# Patient Record
Sex: Female | Born: 1970 | Race: White | Hispanic: No | Marital: Single | Smoking: Current every day smoker
Health system: Southern US, Community
[De-identification: ages and names within clinical notes are randomized; demographics above are authoritative.]

## PROBLEM LIST (undated history)

## (undated) DIAGNOSIS — R569 Unspecified convulsions: Secondary | ICD-10-CM

## (undated) DIAGNOSIS — F1910 Other psychoactive substance abuse, uncomplicated: Secondary | ICD-10-CM

## (undated) DIAGNOSIS — F419 Anxiety disorder, unspecified: Secondary | ICD-10-CM

## (undated) DIAGNOSIS — E785 Hyperlipidemia, unspecified: Secondary | ICD-10-CM

## (undated) DIAGNOSIS — K759 Inflammatory liver disease, unspecified: Secondary | ICD-10-CM

## (undated) DIAGNOSIS — J45909 Unspecified asthma, uncomplicated: Secondary | ICD-10-CM

## (undated) DIAGNOSIS — I10 Essential (primary) hypertension: Secondary | ICD-10-CM

## (undated) HISTORY — DX: Hyperlipidemia, unspecified: E78.5

## (undated) HISTORY — DX: Inflammatory liver disease, unspecified: K75.9

## (undated) HISTORY — DX: Essential (primary) hypertension: I10

## (undated) HISTORY — DX: Unspecified asthma, uncomplicated: J45.909

## (undated) HISTORY — DX: Anxiety disorder, unspecified: F41.9

## (undated) HISTORY — DX: Unspecified convulsions: R56.9

## (undated) HISTORY — DX: Other psychoactive substance abuse, uncomplicated: F19.10

---

## 2019-12-14 DIAGNOSIS — D219 Benign neoplasm of connective and other soft tissue, unspecified: Secondary | ICD-10-CM

## 2019-12-14 DIAGNOSIS — F1290 Cannabis use, unspecified, uncomplicated: Secondary | ICD-10-CM

## 2019-12-14 DIAGNOSIS — N390 Urinary tract infection, site not specified: Secondary | ICD-10-CM

## 2019-12-14 DIAGNOSIS — E876 Hypokalemia: Secondary | ICD-10-CM

## 2019-12-14 DIAGNOSIS — R112 Nausea with vomiting, unspecified: Secondary | ICD-10-CM

## 2020-02-05 ENCOUNTER — Ambulatory Visit: Payer: Self-pay | Admitting: Cardiology

## 2020-02-05 ENCOUNTER — Other Ambulatory Visit: Payer: Self-pay | Admitting: *Deleted

## 2020-02-06 ENCOUNTER — Other Ambulatory Visit: Payer: Self-pay

## 2020-02-06 ENCOUNTER — Ambulatory Visit (INDEPENDENT_AMBULATORY_CARE_PROVIDER_SITE_OTHER): Payer: Self-pay | Admitting: Cardiology

## 2020-02-06 ENCOUNTER — Encounter: Payer: Self-pay | Admitting: Cardiology

## 2020-02-06 VITALS — BP 170/100 | HR 64 | Ht 63.0 in | Wt 129.2 lb

## 2020-02-06 DIAGNOSIS — E782 Mixed hyperlipidemia: Secondary | ICD-10-CM

## 2020-02-06 DIAGNOSIS — R9431 Abnormal electrocardiogram [ECG] [EKG]: Secondary | ICD-10-CM

## 2020-02-06 DIAGNOSIS — Z720 Tobacco use: Secondary | ICD-10-CM

## 2020-02-06 DIAGNOSIS — I10 Essential (primary) hypertension: Secondary | ICD-10-CM

## 2020-02-06 DIAGNOSIS — R079 Chest pain, unspecified: Secondary | ICD-10-CM

## 2020-02-06 MED ORDER — NITROGLYCERIN 0.4 MG SL SUBL: 0.4000 mg | SUBLINGUAL_TABLET | SUBLINGUAL | 11 refills | Status: AC | PRN

## 2020-02-06 MED ORDER — LOSARTAN POTASSIUM 25 MG PO TABS
25.0000 mg | ORAL_TABLET | Freq: Every day | ORAL | 1 refills | Status: AC
Start: 2020-02-06 — End: 2020-05-06

## 2020-02-06 MED ORDER — ROSUVASTATIN CALCIUM 5 MG PO TABS
5.0000 mg | ORAL_TABLET | Freq: Every day | ORAL | 1 refills | Status: AC
Start: 2020-02-06 — End: 2020-05-06

## 2020-02-06 NOTE — Patient Instructions (Addendum)
Medication Instructions:  Your physician has recommended you make the following change in your medication:   STOP: Lipitor   START: Losartan 25 mg daily   START: Crestor 5 mg daily   TAKE NITRO AS NEEDED FOR CHEST PAIN: Nitroglycerin 0.4 mg sublingual (under your tongue) as needed for chest pain. If experiencing chest pain, stop what you are doing and sit down. Take 1 nitroglycerin and wait 5 minutes. If chest pain continues, take another nitroglycerin and wait 5 minutes. If chest pain does not subside, take 1 more nitroglycerin and dial 911. You make take a total of 3 nitroglycerin in a 15 minute time frame.   *If you need a refill on your cardiac medications before your next appointment, please call your pharmacy*   Lab Work: None.  If you have labs (blood work) drawn today and your tests are completely normal, you will receive your results only by: Marland Kitchen MyChart Message (if you have MyChart) OR . A paper copy in the mail If you have any lab test that is abnormal or we need to change your treatment, we will call you to review the results.   Testing/Procedures: Your physician has requested that you have an echocardiogram. Echocardiography is a painless test that uses sound waves to create images of your heart. It provides your doctor with information about the size and shape of your heart and how well your heart's chambers and valves are working. This procedure takes approximately one hour. There are no restrictions for this procedure.     [facility] [address] [place], [place] [address] Phone:  [phone]    Please arrive 15 minutes prior to your appointment time for registration and insurance purposes.  The test will take approximately 3 to 4 hours to complete; you may bring reading material.  If someone comes with you to your appointment, they will need to remain in the main lobby due to limited space in the testing area. **If you are pregnant  or breastfeeding, please notify the nuclear lab prior to your appointment**  How to prepare for your Myocardial Perfusion Test: . Do not eat or drink 3 hours prior to your test, except you may have water. . Do not consume products containing caffeine (regular or decaffeinated) 12 hours prior to your test. (ex: coffee, chocolate, sodas, tea). . Do bring a list of your current medications with you.  If not listed below, you may take your medications as normal. . Do wear comfortable clothes (no dresses or overalls) and walking shoes, tennis shoes preferred (No heels or open toe shoes are allowed). . Do NOT wear cologne, perfume, aftershave, or lotions (deodorant is allowed). . If these instructions are not followed, your test will have to be rescheduled.  Please report to [address] for your test.  If you have questions or concerns about your appointment, you can call the [facility] at [phone].  If you cannot keep your appointment, please provide 24 hours notification to the Nuclear Lab, to avoid a possible $50 charge to your account.    Follow-Up: At [facility], you and your health needs are our priority.  As part of our continuing mission to provide you with exceptional heart care, we have created designated Provider Care Teams.  These Care Teams include your primary Cardiologist (physician) and Advanced Practice Providers (APPs -  Physician Assistants and Nurse Practitioners) who all work together to provide you with the care you  need, when you need it.  We recommend signing up for the patient portal called "MyChart".  Sign up information is provided on this After Visit Summary.  MyChart is used to connect with patients for Virtual Visits (Telemedicine).  Patients are able to view lab/test results, encounter notes, upcoming appointments, etc.  Non-urgent messages can be sent to your provider as well.   To learn more about what you can do with  MyChart, go to [email or web address].    Your next appointment:   1 month(s)  The format for your next appointment:   In Person  Provider:   Berniece Salines, DO   Other Instructions   Cardiac Nuclear Scan A cardiac nuclear scan is a test that measures blood flow to the heart when a person is resting and when he or she is exercising. The test looks for problems such as:  Not enough blood reaching a portion of the heart.  The heart muscle not working normally. You may need this test if:  You have heart disease.  You have had abnormal lab results.  You have had heart surgery or a balloon procedure to open up blocked arteries (angioplasty).  You have chest pain.  You have shortness of breath. In this test, a radioactive dye (tracer) is injected into your bloodstream. After the tracer has traveled to your heart, an imaging device is used to measure how much of the tracer is absorbed by or distributed to various areas of your heart. This procedure is usually done at a hospital and takes 2-4 hours. Tell a health care provider about:  Any allergies you have.  All medicines you are taking, including vitamins, herbs, eye drops, creams, and over-the-counter medicines.  Any problems you or family members have had with anesthetic medicines.  Any blood disorders you have.  Any surgeries you have had.  Any medical conditions you have.  Whether you are pregnant or may be pregnant. What are the risks? Generally, this is a safe procedure. However, problems may occur, including:  Serious chest pain and heart attack. This is only a risk if the stress portion of the test is done.  Rapid heartbeat.  Sensation of warmth in your chest. This usually passes quickly.  Allergic reaction to the tracer. What happens before the procedure?  Ask your health care provider about changing or stopping your regular medicines. This is especially important if you are taking diabetes medicines or  blood thinners.  Follow instructions from your health care provider about eating or drinking restrictions.  Remove your jewelry on the day of the procedure. What happens during the procedure?  An IV will be inserted into one of your veins.  Your health care provider will inject a small amount of radioactive tracer through the IV.  You will wait for 20-40 minutes while the tracer travels through your bloodstream.  Your heart activity will be monitored with an electrocardiogram (ECG).  You will lie down on an exam table.  Images of your heart will be taken for about 15-20 minutes.  You may also have a stress test. For this test, one of the following may be done: ? You will exercise on a treadmill or stationary bike. While you exercise, your heart's activity will be monitored with an ECG, and your blood pressure will be checked. ? You will be given medicines that will increase blood flow to parts of your heart. This is done if you are unable to exercise.  When blood flow to your heart has  peaked, a tracer will again be injected through the IV.  After 20-40 minutes, you will get back on the exam table and have more images taken of your heart.  Depending on the type of tracer used, scans may need to be repeated 3-4 hours later.  Your IV line will be removed when the procedure is over. The procedure may vary among health care providers and hospitals. What happens after the procedure?  Unless your health care provider tells you otherwise, you may return to your normal schedule, including diet, activities, and medicines.  Unless your health care provider tells you otherwise, you may increase your fluid intake. This will help to flush the contrast dye from your body. Drink enough fluid to keep your urine pale yellow.  Ask your health care provider, or the department that is doing the test: ? When will my results be ready? ? How will I get my results? Summary  A cardiac nuclear scan  measures the blood flow to the heart when a person is resting and when he or she is exercising.  Tell your health care provider if you are pregnant.  Before the procedure, ask your health care provider about changing or stopping your regular medicines. This is especially important if you are taking diabetes medicines or blood thinners.  After the procedure, unless your health care provider tells you otherwise, increase your fluid intake. This will help flush the contrast dye from your body.  After the procedure, unless your health care provider tells you otherwise, you may return to your normal schedule, including diet, activities, and medicines. This information is not intended to replace advice given to you by your health care provider. Make sure you discuss any questions you have with your health care provider. Document Revised: 01/30/2018 Document Reviewed: 01/30/2018 Elsevier Patient Education  [place].

## 2020-02-06 NOTE — Progress Notes (Signed)
Cardiology Office Note:    Date:  02/06/2020   ID:  [name], DOB 09/27/1970, MRN [id number]  PCP:  Christiane Ha, FNP  Cardiologist:  Berniece Salines, DO  Electrophysiologist:  None   Referring MD: Christiane Ha, FNP   " I have been experiencing chest pain.     History of Present Illness:    Allison Kramer is a 49 y.o. female with a hx of a hypertension, hyperlipidemia presents today to be evaluated for chest pain shortness of breath.  The patient tells me for several months she has been experiencing intermittent chest pain.  She describes it as a midsternal stabbing pain that starts from the mid chest and radiates to her right arm and then down her shoulders and also her upper jaw.  She notes that sometimes it started as epigastric but most time is midsternal.  She states that whenever this happened she experienced significant shortness of breath with diaphoresis.  It last for few minutes prior to resolution.  She quantifies the pain as 6 out of 10.  She notes that most recent episode was about 1 week.  She denies any dizziness, lightheadedness.  Past Medical History:  Diagnosis Date  . Anxiety   . Asthma   . Drug abuse ([place])   . Hepatitis   . Hyperlipidemia   . Hypertension   . Seizures ([place])     History reviewed. No pertinent surgical history.  Current Medications: Current Meds  Medication Sig  . busPIRone (BUSPAR) 15 MG tablet Take by mouth.  . Cetirizine HCl 10 MG CAPS   . divalproex (DEPAKOTE ER) 500 MG 24 hr tablet Take by mouth.  . hydrOXYzine (ATARAX/VISTARIL) 50 MG tablet Take by mouth.  . mirtazapine (REMERON) 15 MG tablet Take 15 mg by mouth as needed (sleep).   . phenylephrine (SUDAFED PE) 10 MG TABS tablet Take 10 mg by mouth every 6 (six) hours as needed.   . valACYclovir (VALTREX) 1000 MG tablet Take by mouth.  . [DISCONTINUED] risperiDONE (RISPERDAL) 1 MG tablet Take 1 mg by mouth.      Allergies:   Cephalexin, Doxycycline,  Erythromycin base, Penicillins, Carbamazepine, Clonidine, Erythromycin, Lithium, Other, Oxcarbazepine, Sulfamethoxazole, and Latex   Social History   Socioeconomic History  . Marital status: Single    Spouse name: Not on file  . Number of children: Not on file  . Years of education: Not on file  . Highest education level: Not on file  Occupational History  . Not on file  Tobacco Use  . Smoking status: Current Every Day Smoker  . Smokeless tobacco: Never Used  Substance and Sexual Activity  . Alcohol use: Not Currently  . Drug use: Yes  . Sexual activity: Not on file  Other Topics Concern  . Not on file  Social History Narrative  . Not on file   Social Determinants of Health   Financial Resource Strain:   . Difficulty of Paying Living Expenses:   Food Insecurity:   . Worried About Charity fundraiser in the Last Year:   . Arboriculturist in the Last Year:   Transportation Needs:   . Film/video editor (Medical):   Marland Kitchen Lack of Transportation (Non-Medical):   Physical Activity:   . Days of Exercise per Week:   . Minutes of Exercise per Session:   Stress:   . Feeling of Stress :   Social Connections:   . Frequency of Communication with Friends and Family:   .  Frequency of Social Gatherings with Friends and Family:   . Attends Religious Services:   . Active Member of Clubs or Organizations:   . Attends Archivist Meetings:   Marland Kitchen Marital Status:      Family History: The patient's family history includes Cancer in her brother, father, and mother.  ROS:   Review of Systems  Constitution: Negative for decreased appetite, fever and weight gain.  HENT: Negative for congestion, ear discharge, hoarse voice and sore throat.   Eyes: Negative for discharge, redness, vision loss in right eye and visual halos.  Cardiovascular: Reports for chest pain, dyspnea on exertion.  Negative for leg swelling, orthopnea and palpitations.  Respiratory: Negative for cough,  hemoptysis, shortness of breath and snoring.   Endocrine: Negative for heat intolerance and polyphagia.  Hematologic/Lymphatic: Negative for bleeding problem. Does not bruise/bleed easily.  Skin: Negative for flushing, nail changes, rash and suspicious lesions.  Musculoskeletal: Negative for arthritis, joint pain, muscle cramps, myalgias, neck pain and stiffness.  Gastrointestinal: Negative for abdominal pain, bowel incontinence, diarrhea and excessive appetite.  Genitourinary: Negative for decreased libido, genital sores and incomplete emptying.  Neurological: Negative for brief paralysis, focal weakness, headaches and loss of balance.  Psychiatric/Behavioral: Negative for altered mental status, depression and suicidal ideas.  Allergic/Immunologic: Negative for HIV exposure and persistent infections.    EKGs/Labs/Other Studies Reviewed:    The following studies were reviewed today:   EKG:  The ekg ordered today demonstrates sinus rhythm, heart rate 64 bpm with sinus arrhythmia.  Nonspecific ST changes in the lateral leads compared to EKG done on December 14, 2019 at [facility] at which time he was in sinus bradycardia and those changes were also noted  CT abdomen pelvis: December 14, 2019 Impression CT findings consistent with focal pyelonephritis/lobular nephronia involving the upper pole midpole junction region of the left kidney.  No abscess.  Slight bladder wall enhancement could suggest cystitis.  No other significant finding  Recent Labs: Lower done on December 15, 2019: Sodium 138, potassium 3.9, chloride 104, bicarb 24, BUN 19, creatinine 0.7, glucose 131 WBC 12.4, hemoglobin 12.0, hematocrit 35.7, platelet 273 Recent Lipid Panel No results found for: CHOL, TRIG, HDL, CHOLHDL, VLDL, LDLCALC, LDLDIRECT  Physical Exam:    VS:  BP (!) 170/100 (BP Location: Left Arm, Patient Position: Sitting, Cuff Size: Normal)   Pulse 64   Ht 5\' 3"  (1.6 m)   Wt 129 lb 3.2 oz (58.6 kg)   SpO2  98% Comment: at rest  BMI 22.89 kg/m     Wt Readings from Last 3 Encounters:  02/06/20 129 lb 3.2 oz (58.6 kg)     GEN: Well nourished, well developed in no acute distress HEENT: Normal NECK: No JVD; No carotid bruits LYMPHATICS: No lymphadenopathy CARDIAC: S1S2 noted,RRR, no murmurs, rubs, gallops RESPIRATORY:  Clear to auscultation without rales, wheezing or rhonchi  ABDOMEN: Soft, non-tender, non-distended, +bowel sounds, no guarding. EXTREMITIES: No edema, No cyanosis, no clubbing MUSCULOSKELETAL:  No deformity  SKIN: Warm and dry NEUROLOGIC:  Alert and oriented x 3, non-focal PSYCHIATRIC:  Normal affect, good insight  ASSESSMENT:    1. Abnormal EKG   2. Chest pain, moderate coronary artery risk   3. Essential hypertension   4. Tobacco use   5. Mixed hyperlipidemia    PLAN:     1.  Her chest pain is concerning given her intermediate risk factors (hypertension, hyperlipidemia, smoker) for coronary artery disease I like to proceed with ischemic evaluation in this  patient.  We have discussed a pharmacologic nuclear stress test  she is willing to proceed with this.  Sublingual nitroglycerin prescription was sent, its protocol and 911 protocol explained and the patient vocalized understanding questions were answered to the patient's satisfaction.  2.  Is hypertensive in the office today started patient on losartan 25 mg daily..  3.  Past history of hyperlipidemia she has been on Lipitor but she tells me she stopped his medication as she did have vomiting every time she takes it.  Advised to stop her Lipitor and start her on Crestor 5 mg daily hoping for better reaction.  4.  Shortness of breath-I like to get an echocardiogram to assess LV and RV function, any other valvular abnormalities.  5.  Tobacco use-the patient was counseled on tobacco cessation today for 5 minutes.  Counseling included reviewing the risks of smoking tobacco products, how it impacts the patient's current  medical diagnoses and different strategies for quitting.  Pharmacotherapy to aid in tobacco cessation was not prescribed today. The patient coordinate with  primary care provider.  The patient was also advised to call   1-800-QUIT-NOW 731-266-3072) for additional help with quitting smoking.  The patient is in agreement with the above plan. The patient left the office in stable condition.  The patient will follow up in 1 month or sooner if needed.   Medication Adjustments/Labs and Tests Ordered: Current medicines are reviewed at length with the patient today.  Concerns regarding medicines are outlined above.  Orders Placed This Encounter  Procedures  . MYOCARDIAL PERFUSION IMAGING  . EKG 12-Lead  . ECHOCARDIOGRAM COMPLETE   Meds ordered this encounter  Medications  . losartan (COZAAR) 25 MG tablet    Sig: Take 1 tablet (25 mg total) by mouth daily.    Dispense:  90 tablet    Refill:  1  . rosuvastatin (CRESTOR) 5 MG tablet    Sig: Take 1 tablet (5 mg total) by mouth daily.    Dispense:  90 tablet    Refill:  1  . nitroGLYCERIN (NITROSTAT) 0.4 MG SL tablet    Sig: Place 1 tablet (0.4 mg total) under the tongue every 5 (five) minutes as needed for chest pain.    Dispense:  25 tablet    Refill:  11    Patient Instructions  Medication Instructions:  Your physician has recommended you make the following change in your medication:   STOP: Lipitor   START: Losartan 25 mg daily   START: Crestor 5 mg daily   TAKE NITRO AS NEEDED FOR CHEST PAIN: Nitroglycerin 0.4 mg sublingual (under your tongue) as needed for chest pain. If experiencing chest pain, stop what you are doing and sit down. Take 1 nitroglycerin and wait 5 minutes. If chest pain continues, take another nitroglycerin and wait 5 minutes. If chest pain does not subside, take 1 more nitroglycerin and dial 911. You make take a total of 3 nitroglycerin in a 15 minute time frame.   *If you need a refill on your cardiac  medications before your next appointment, please call your pharmacy*   Lab Work: None.  If you have labs (blood work) drawn today and your tests are completely normal, you will receive your results only by: Marland Kitchen MyChart Message (if you have MyChart) OR . A paper copy in the mail If you have any lab test that is abnormal or we need to change your treatment, we will call you to review the results.  Testing/Procedures: Your physician has requested that you have an echocardiogram. Echocardiography is a painless test that uses sound waves to create images of your heart. It provides your doctor with information about the size and shape of your heart and how well your heart's chambers and valves are working. This procedure takes approximately one hour. There are no restrictions for this procedure.     [facility] [address] [place], [place] [address] Phone:  [phone]    Please arrive 15 minutes prior to your appointment time for registration and insurance purposes.  The test will take approximately 3 to 4 hours to complete; you may bring reading material.  If someone comes with you to your appointment, they will need to remain in the main lobby due to limited space in the testing area. **If you are pregnant or breastfeeding, please notify the nuclear lab prior to your appointment**  How to prepare for your Myocardial Perfusion Test: . Do not eat or drink 3 hours prior to your test, except you may have water. . Do not consume products containing caffeine (regular or decaffeinated) 12 hours prior to your test. (ex: coffee, chocolate, sodas, tea). . Do bring a list of your current medications with you.  If not listed below, you may take your medications as normal. . Do wear comfortable clothes (no dresses or overalls) and walking shoes, tennis shoes preferred (No heels or open toe shoes are allowed). . Do NOT wear cologne, perfume, aftershave, or lotions  (deodorant is allowed). . If these instructions are not followed, your test will have to be rescheduled.  Please report to [address] for your test.  If you have questions or concerns about your appointment, you can call the [facility] at [phone].  If you cannot keep your appointment, please provide 24 hours notification to the Nuclear Lab, to avoid a possible $50 charge to your account.    Follow-Up: At [facility], you and your health needs are our priority.  As part of our continuing mission to provide you with exceptional heart care, we have created designated Provider Care Teams.  These Care Teams include your primary Cardiologist (physician) and Advanced Practice Providers (APPs -  Physician Assistants and Nurse Practitioners) who all work together to provide you with the care you need, when you need it.  We recommend signing up for the patient portal called "MyChart".  Sign up information is provided on this After Visit Summary.  MyChart is used to connect with patients for Virtual Visits (Telemedicine).  Patients are able to view lab/test results, encounter notes, upcoming appointments, etc.  Non-urgent messages can be sent to your provider as well.   To learn more about what you can do with MyChart, go to [email or web address].    Your next appointment:   1 month(s)  The format for your next appointment:   In Person  Provider:   Berniece Salines, DO   Other Instructions   Cardiac Nuclear Scan A cardiac nuclear scan is a test that measures blood flow to the heart when a person is resting and when he or she is exercising. The test looks for problems such as:  Not enough blood reaching a portion of the heart.  The heart muscle not working normally. You may need this test if:  You have heart disease.  You have had abnormal lab results.  You have had heart surgery or a balloon procedure to open up blocked arteries  (  angioplasty).  You have chest pain.  You have shortness of breath. In this test, a radioactive dye (tracer) is injected into your bloodstream. After the tracer has traveled to your heart, an imaging device is used to measure how much of the tracer is absorbed by or distributed to various areas of your heart. This procedure is usually done at a hospital and takes 2-4 hours. Tell a health care provider about:  Any allergies you have.  All medicines you are taking, including vitamins, herbs, eye drops, creams, and over-the-counter medicines.  Any problems you or family members have had with anesthetic medicines.  Any blood disorders you have.  Any surgeries you have had.  Any medical conditions you have.  Whether you are pregnant or may be pregnant. What are the risks? Generally, this is a safe procedure. However, problems may occur, including:  Serious chest pain and heart attack. This is only a risk if the stress portion of the test is done.  Rapid heartbeat.  Sensation of warmth in your chest. This usually passes quickly.  Allergic reaction to the tracer. What happens before the procedure?  Ask your health care provider about changing or stopping your regular medicines. This is especially important if you are taking diabetes medicines or blood thinners.  Follow instructions from your health care provider about eating or drinking restrictions.  Remove your jewelry on the day of the procedure. What happens during the procedure?  An IV will be inserted into one of your veins.  Your health care provider will inject a small amount of radioactive tracer through the IV.  You will wait for 20-40 minutes while the tracer travels through your bloodstream.  Your heart activity will be monitored with an electrocardiogram (ECG).  You will lie down on an exam table.  Images of your heart will be taken for about 15-20 minutes.  You may also have a stress test. For this test, one  of the following may be done: ? You will exercise on a treadmill or stationary bike. While you exercise, your heart's activity will be monitored with an ECG, and your blood pressure will be checked. ? You will be given medicines that will increase blood flow to parts of your heart. This is done if you are unable to exercise.  When blood flow to your heart has peaked, a tracer will again be injected through the IV.  After 20-40 minutes, you will get back on the exam table and have more images taken of your heart.  Depending on the type of tracer used, scans may need to be repeated 3-4 hours later.  Your IV line will be removed when the procedure is over. The procedure may vary among health care providers and hospitals. What happens after the procedure?  Unless your health care provider tells you otherwise, you may return to your normal schedule, including diet, activities, and medicines.  Unless your health care provider tells you otherwise, you may increase your fluid intake. This will help to flush the contrast dye from your body. Drink enough fluid to keep your urine pale yellow.  Ask your health care provider, or the department that is doing the test: ? When will my results be ready? ? How will I get my results? Summary  A cardiac nuclear scan measures the blood flow to the heart when a person is resting and when he or she is exercising.  Tell your health care provider if you are pregnant.  Before the procedure, ask your  health care provider about changing or stopping your regular medicines. This is especially important if you are taking diabetes medicines or blood thinners.  After the procedure, unless your health care provider tells you otherwise, increase your fluid intake. This will help flush the contrast dye from your body.  After the procedure, unless your health care provider tells you otherwise, you may return to your normal schedule, including diet, activities, and  medicines. This information is not intended to replace advice given to you by your health care provider. Make sure you discuss any questions you have with your health care provider. Document Revised: 01/30/2018 Document Reviewed: 01/30/2018 Elsevier Patient Education  [place].      Adopting a Healthy Lifestyle.  Know what a healthy weight is for you (roughly BMI <25) and aim to maintain this   Aim for 7+ servings of fruits and vegetables daily   65-80+ fluid ounces of water or unsweet tea for healthy kidneys   Limit to max 1 drink of alcohol per day; avoid smoking/tobacco   Limit animal fats in diet for cholesterol and heart health - choose grass fed whenever available   Avoid highly processed foods, and foods high in saturated/trans fats   Aim for low stress - take time to unwind and care for your mental health   Aim for 150 min of moderate intensity exercise weekly for heart health, and weights twice weekly for bone health   Aim for 7-9 hours of sleep daily   When it comes to diets, agreement about the perfect plan isnt easy to find, even among the experts. Experts at the [employer] developed an idea known as the Healthy Eating Plate. Just imagine a plate divided into logical, healthy portions.   The emphasis is on diet quality:   Load up on vegetables and fruits - one-half of your plate: Aim for color and variety, and remember that potatoes dont count.   Go for whole grains - one-quarter of your plate: Whole wheat, barley, wheat berries, quinoa, oats, brown rice, and foods made with them. If you want pasta, go with whole wheat pasta.   Protein power - one-quarter of your plate: Fish, chicken, beans, and nuts are all healthy, versatile protein sources. Limit red meat.   The diet, however, does go beyond the plate, offering a few other suggestions.   Use healthy plant oils, such as olive, canola, soy, corn, sunflower and peanut. Check the  labels, and avoid partially hydrogenated oil, which have unhealthy trans fats.   If youre thirsty, drink water. Coffee and tea are good in moderation, but skip sugary drinks and limit milk and dairy products to one or two daily servings.   The type of carbohydrate in the diet is more important than the amount. Some sources of carbohydrates, such as vegetables, fruits, whole grains, and beans-are healthier than others.   Finally, stay active  Signed, Berniece Salines, DO  02/06/2020 9:39 PM    [facility]

## 2020-02-11 ENCOUNTER — Other Ambulatory Visit (HOSPITAL_COMMUNITY): Payer: Self-pay

## 2020-02-11 ENCOUNTER — Observation Stay (HOSPITAL_COMMUNITY)
Admission: EM | Admit: 2020-02-11 | Discharge: 2020-02-12 | Disposition: A | Discharge status: Home / Self Care | Payer: Self-pay | Attending: Internal Medicine | Admitting: Internal Medicine

## 2020-02-11 ENCOUNTER — Emergency Department (HOSPITAL_COMMUNITY): Payer: Self-pay

## 2020-02-11 ENCOUNTER — Encounter (HOSPITAL_COMMUNITY): Payer: Self-pay | Admitting: Student

## 2020-02-11 ENCOUNTER — Other Ambulatory Visit: Payer: Self-pay

## 2020-02-11 DIAGNOSIS — Z809 Family history of malignant neoplasm, unspecified: Secondary | ICD-10-CM | POA: Insufficient documentation

## 2020-02-11 DIAGNOSIS — R079 Chest pain, unspecified: Secondary | ICD-10-CM | POA: Diagnosis present

## 2020-02-11 DIAGNOSIS — F39 Unspecified mood [affective] disorder: Secondary | ICD-10-CM

## 2020-02-11 DIAGNOSIS — R110 Nausea: Secondary | ICD-10-CM | POA: Insufficient documentation

## 2020-02-11 DIAGNOSIS — R569 Unspecified convulsions: Secondary | ICD-10-CM | POA: Insufficient documentation

## 2020-02-11 DIAGNOSIS — I10 Essential (primary) hypertension: Secondary | ICD-10-CM | POA: Diagnosis present

## 2020-02-11 DIAGNOSIS — R0789 Other chest pain: Principal | ICD-10-CM | POA: Insufficient documentation

## 2020-02-11 DIAGNOSIS — F419 Anxiety disorder, unspecified: Secondary | ICD-10-CM | POA: Insufficient documentation

## 2020-02-11 DIAGNOSIS — Z880 Allergy status to penicillin: Secondary | ICD-10-CM | POA: Insufficient documentation

## 2020-02-11 DIAGNOSIS — Z882 Allergy status to sulfonamides status: Secondary | ICD-10-CM | POA: Insufficient documentation

## 2020-02-11 DIAGNOSIS — R072 Precordial pain: Secondary | ICD-10-CM

## 2020-02-11 DIAGNOSIS — F17210 Nicotine dependence, cigarettes, uncomplicated: Secondary | ICD-10-CM | POA: Insufficient documentation

## 2020-02-11 DIAGNOSIS — Z20822 Contact with and (suspected) exposure to covid-19: Secondary | ICD-10-CM | POA: Insufficient documentation

## 2020-02-11 DIAGNOSIS — Z881 Allergy status to other antibiotic agents status: Secondary | ICD-10-CM | POA: Insufficient documentation

## 2020-02-11 DIAGNOSIS — F1410 Cocaine abuse, uncomplicated: Secondary | ICD-10-CM | POA: Insufficient documentation

## 2020-02-11 DIAGNOSIS — Z791 Long term (current) use of non-steroidal anti-inflammatories (NSAID): Secondary | ICD-10-CM | POA: Insufficient documentation

## 2020-02-11 DIAGNOSIS — E782 Mixed hyperlipidemia: Secondary | ICD-10-CM | POA: Diagnosis present

## 2020-02-11 DIAGNOSIS — F1990 Other psychoactive substance use, unspecified, uncomplicated: Secondary | ICD-10-CM

## 2020-02-11 DIAGNOSIS — Z888 Allergy status to other drugs, medicaments and biological substances status: Secondary | ICD-10-CM | POA: Insufficient documentation

## 2020-02-11 DIAGNOSIS — F1110 Opioid abuse, uncomplicated: Secondary | ICD-10-CM | POA: Insufficient documentation

## 2020-02-11 DIAGNOSIS — M25511 Pain in right shoulder: Secondary | ICD-10-CM | POA: Insufficient documentation

## 2020-02-11 DIAGNOSIS — E876 Hypokalemia: Secondary | ICD-10-CM

## 2020-02-11 DIAGNOSIS — Z91040 Latex allergy status: Secondary | ICD-10-CM | POA: Insufficient documentation

## 2020-02-11 DIAGNOSIS — Z720 Tobacco use: Secondary | ICD-10-CM | POA: Diagnosis present

## 2020-02-11 DIAGNOSIS — Z7982 Long term (current) use of aspirin: Secondary | ICD-10-CM | POA: Insufficient documentation

## 2020-02-11 LAB — SARS CORONAVIRUS 2 BY RT PCR (HOSPITAL ORDER, PERFORMED IN ~~LOC~~ HOSPITAL LAB): SARS Coronavirus 2: NEGATIVE

## 2020-02-11 LAB — CBC WITH DIFFERENTIAL/PLATELET
Abs Immature Granulocytes: 0 10*3/uL (ref 0.00–0.07)
Basophils Absolute: 0.1 10*3/uL (ref 0.0–0.1)
Basophils Relative: 1 %
Eosinophils Absolute: 0.1 10*3/uL (ref 0.0–0.5)
Eosinophils Relative: 1 %
HCT: 39.6 % (ref 36.0–46.0)
Hemoglobin: 13.3 g/dL (ref 12.0–15.0)
Immature Granulocytes: 0 %
Lymphocytes Relative: 52 %
Lymphs Abs: 3.4 10*3/uL (ref 0.7–4.0)
MCH: 31.1 pg (ref 26.0–34.0)
MCHC: 33.6 g/dL (ref 30.0–36.0)
MCV: 92.5 fL (ref 80.0–100.0)
Monocytes Absolute: 0.6 10*3/uL (ref 0.1–1.0)
Monocytes Relative: 9 %
Neutro Abs: 2.4 10*3/uL (ref 1.7–7.7)
Neutrophils Relative %: 37 %
Platelets: 320 10*3/uL (ref 150–400)
RBC: 4.28 MIL/uL (ref 3.87–5.11)
RDW: 14.1 % (ref 11.5–15.5)
WBC: 6.6 10*3/uL (ref 4.0–10.5)
nRBC: 0 % (ref 0.0–0.2)

## 2020-02-11 LAB — COMPREHENSIVE METABOLIC PANEL
ALT: 13 U/L (ref 0–44)
AST: 16 U/L (ref 15–41)
Albumin: 4.7 g/dL (ref 3.5–5.0)
Alkaline Phosphatase: 65 U/L (ref 38–126)
Anion gap: 13 (ref 5–15)
BUN: 14 mg/dL (ref 6–20)
CO2: 24 mmol/L (ref 22–32)
Calcium: 9.3 mg/dL (ref 8.9–10.3)
Chloride: 106 mmol/L (ref 98–111)
Creatinine, Ser: 0.65 mg/dL (ref 0.44–1.00)
GFR calc Af Amer: >60 mL/min (ref >60)
GFR calc non Af Amer: >60 mL/min (ref >60)
Glucose, Bld: 110 mg/dL — ABNORMAL HIGH (ref 70–99)
Potassium: 3 mmol/L — ABNORMAL LOW (ref 3.5–5.1)
Sodium: 143 mmol/L (ref 135–145)
Total Bilirubin: 0.7 mg/dL (ref 0.3–1.2)
Total Protein: 7.9 g/dL (ref 6.5–8.1)

## 2020-02-11 LAB — RAPID URINE DRUG SCREEN, HOSP PERFORMED
Amphetamines: NOT DETECTED
Barbiturates: NOT DETECTED
Benzodiazepines: NOT DETECTED
Cocaine: NOT DETECTED
Opiates: POSITIVE — AB
Tetrahydrocannabinol: POSITIVE — AB

## 2020-02-11 LAB — TROPONIN I (HIGH SENSITIVITY)
Troponin I (High Sensitivity): 3 ng/L (ref <18)
Troponin I (High Sensitivity): 3 ng/L (ref <18)

## 2020-02-11 LAB — MAGNESIUM: Magnesium: 2.1 mg/dL (ref 1.7–2.4)

## 2020-02-11 MED ORDER — HYDROXYZINE HCL 25 MG PO TABS
50.0000 mg | ORAL_TABLET | Freq: Three times a day (TID) | ORAL | Status: DC
  Administered 2020-02-11 – 2020-02-12 (×3): 50 mg via ORAL
  Filled 2020-02-11 (×3): qty 2

## 2020-02-11 MED ORDER — ENOXAPARIN SODIUM 40 MG/0.4ML ~~LOC~~ SOLN
40.0000 mg | SUBCUTANEOUS | Status: DC
  Administered 2020-02-11: 40 mg via SUBCUTANEOUS
  Filled 2020-02-11: qty 0.4

## 2020-02-11 MED ORDER — ASPIRIN EC 81 MG PO TBEC
81.0000 mg | DELAYED_RELEASE_TABLET | Freq: Every day | ORAL | Status: DC
  Administered 2020-02-12: 81 mg via ORAL
  Filled 2020-02-11: qty 1

## 2020-02-11 MED ORDER — LOSARTAN POTASSIUM 25 MG PO TABS
25.0000 mg | ORAL_TABLET | Freq: Every day | ORAL | Status: DC
  Administered 2020-02-12: 25 mg via ORAL
  Filled 2020-02-11 (×2): qty 1

## 2020-02-11 MED ORDER — POTASSIUM CHLORIDE CRYS ER 20 MEQ PO TBCR
40.0000 meq | EXTENDED_RELEASE_TABLET | Freq: Once | ORAL | Status: AC
  Administered 2020-02-11: 40 meq via ORAL
  Filled 2020-02-11: qty 2

## 2020-02-11 MED ORDER — NITROGLYCERIN 0.4 MG SL SUBL
0.4000 mg | SUBLINGUAL_TABLET | SUBLINGUAL | Status: DC | PRN
  Administered 2020-02-11 (×2): 0.4 mg via SUBLINGUAL
  Filled 2020-02-11 (×2): qty 1

## 2020-02-11 MED ORDER — ASPIRIN 81 MG PO CHEW
324.0000 mg | CHEWABLE_TABLET | Freq: Once | ORAL | Status: AC
  Administered 2020-02-11: 324 mg via ORAL
  Filled 2020-02-11: qty 4

## 2020-02-11 MED ORDER — POTASSIUM CHLORIDE 20 MEQ/15ML (10%) PO SOLN
40.0000 meq | Freq: Once | ORAL | Status: AC
  Administered 2020-02-11: 40 meq via ORAL
  Filled 2020-02-11: qty 30

## 2020-02-11 MED ORDER — ROSUVASTATIN CALCIUM 5 MG PO TABS
5.0000 mg | ORAL_TABLET | Freq: Every day | ORAL | Status: DC
  Administered 2020-02-11 – 2020-02-12 (×2): 5 mg via ORAL
  Filled 2020-02-11 (×3): qty 1

## 2020-02-11 MED ORDER — NICOTINE 21 MG/24HR TD PT24
21.0000 mg | MEDICATED_PATCH | Freq: Every day | TRANSDERMAL | Status: DC
  Administered 2020-02-12: 21 mg via TRANSDERMAL
  Filled 2020-02-11 (×2): qty 1

## 2020-02-11 MED ORDER — ONDANSETRON HCL 4 MG/2ML IJ SOLN: 4.0000 mg | Freq: Four times a day (QID) | INTRAMUSCULAR | Status: DC | PRN

## 2020-02-11 MED ORDER — PREGABALIN 100 MG PO CAPS
100.0000 mg | ORAL_CAPSULE | Freq: Two times a day (BID) | ORAL | Status: DC
  Administered 2020-02-11 – 2020-02-12 (×2): 100 mg via ORAL
  Filled 2020-02-11: qty 2
  Filled 2020-02-11: qty 1

## 2020-02-11 MED ORDER — BUSPIRONE HCL 5 MG PO TABS
15.0000 mg | ORAL_TABLET | Freq: Three times a day (TID) | ORAL | Status: DC
  Administered 2020-02-11 – 2020-02-12 (×3): 15 mg via ORAL
  Filled 2020-02-11: qty 2
  Filled 2020-02-11 (×2): qty 3

## 2020-02-11 MED ORDER — ACETAMINOPHEN 325 MG PO TABS: 650.0000 mg | ORAL_TABLET | ORAL | Status: DC | PRN

## 2020-02-11 MED ORDER — MORPHINE SULFATE (PF) 2 MG/ML IV SOLN: 1.0000 mg | INTRAVENOUS | Status: DC | PRN

## 2020-02-11 MED ORDER — DIVALPROEX SODIUM ER 500 MG PO TB24
500.0000 mg | ORAL_TABLET | Freq: Every day | ORAL | Status: DC
  Administered 2020-02-11: 500 mg via ORAL
  Filled 2020-02-11 (×2): qty 1

## 2020-02-11 NOTE — ED Provider Notes (Signed)
[facility] DEPT Provider Note   CSN: [id number] Arrival date & time: 02/11/20  1736     History Chief Complaint  Patient presents with  . Chest Pain    Allison Kramer is a 49 y.o. female.  The history is provided by the patient and medical records. No language interpreter was used.  Chest Pain  Allison Kramer is a 49 y.o. female who presents to the Emergency Department complaining of chest pain. She presents the emergency department complaining of chest pain that began a few minutes prior to ED arrival. Symptoms began while she was eating at the [employer] cafeteria. She complains of pain in her central chest described as a pressure sensation that radiates to her jaw and right arm. She has associated shortness of breath. When pain began it was a 10 out of 10. It is now 2/10. She denies any fevers. She was recently evaluated by cardiology for abnormal EKG. She states that previously she had experienced intermittent episodes of sharp and stabbing chest pain. Those have been occurring occasionally over the last few weeks. Today's pain is very different and that is dull in nature. She does smoke cigarettes. She does have hypertension, hyperlipidemia. She has a remote history of drug use.    Past Medical History:  Diagnosis Date  . Anxiety   . Asthma   . Drug abuse ([place])   . Hepatitis   . Hyperlipidemia   . Hypertension   . Seizures [facility])     Patient Active Problem List   Diagnosis Date Noted  . Chest pain 02/11/2020  . Hypokalemia 02/11/2020  . Substance use disorder 02/11/2020  . Mood disorder ([place]) 02/11/2020  . Chest pain, moderate coronary artery risk 02/06/2020  . Abnormal EKG 02/06/2020  . Essential hypertension 02/06/2020  . Tobacco use 02/06/2020  . Mixed hyperlipidemia 02/06/2020    History reviewed. No pertinent surgical history.   OB History   No obstetric history on file.     Family History  Problem Relation  Age of Onset  . Cancer Mother   . Cancer Father   . Cancer Brother     Social History   Tobacco Use  . Smoking status: Current Every Day Smoker  . Smokeless tobacco: Never Used  Substance Use Topics  . Alcohol use: Not Currently  . Drug use: Yes    Home Medications Prior to Admission medications   Medication Sig Start Date End Date Taking? Authorizing Provider  busPIRone (BUSPAR) 15 MG tablet Take 15 mg by mouth 3 (three) times daily.  10/26/19  Yes [provider]  Cetirizine HCl 10 MG CAPS Take 10 mg by mouth daily.    Yes [provider]  divalproex (DEPAKOTE ER) 500 MG 24 hr tablet Take 500 mg by mouth daily.  03/18/17  Yes [provider]  hydrOXYzine (ATARAX/VISTARIL) 50 MG tablet Take 50 mg by mouth in the morning, at noon, and at bedtime.  10/26/19  Yes [provider]  ibuprofen (ADVIL) 200 MG tablet Take 400 mg by mouth every 6 (six) hours as needed for moderate pain.   Yes [provider]  losartan (COZAAR) 25 MG tablet Take 1 tablet (25 mg total) by mouth daily. 02/06/20 05/06/20 Yes Tobb, Kardie, DO  mirtazapine (REMERON) 15 MG tablet Take 15 mg by mouth as needed (sleep).  10/26/19  Yes [provider]  nitroGLYCERIN (NITROSTAT) 0.4 MG SL tablet Place 1 tablet (0.4 mg total) under the tongue  every 5 (five) minutes as needed for chest pain. 02/06/20 05/06/20 Yes Tobb, Kardie, DO  OVER THE COUNTER MEDICATION Take 2 tablets by mouth daily. Amberen   Yes [provider]  phenylephrine (SUDAFED PE) 10 MG TABS tablet Take 10 mg by mouth in the morning and at bedtime.    Yes [provider]  pregabalin (LYRICA) 100 MG capsule Take 100 mg by mouth 2 (two) times daily.   Yes [provider]  rosuvastatin (CRESTOR) 5 MG tablet Take 1 tablet (5 mg total) by mouth daily. 02/06/20 05/06/20 Yes Tobb, Kardie, DO  valACYclovir (VALTREX) 1000 MG tablet Take 1,000 mg by mouth daily.    Yes [provider]     Allergies    Cephalexin, Doxycycline, Erythromycin base, Penicillins, Carbamazepine, Clonidine, Lithium, Oxcarbazepine, Sulfamethoxazole, Erythromycin, and Latex  Review of Systems   Review of Systems  Cardiovascular: Positive for chest pain.  All other systems reviewed and are negative.   Physical Exam Updated Vital Signs BP 126/89 (BP Location: Right Arm)   Pulse (!) 51   Temp 98.2 F (36.8 C) (Oral)   Resp 18   Ht 5\' 3"  (1.6 m)   Wt 58.5 kg   SpO2 97%   BMI 22.85 kg/m   Physical Exam Vitals and nursing note reviewed.  Constitutional:      Appearance: She is well-developed.  HENT:     Head: Normocephalic and atraumatic.  Cardiovascular:     Rate and Rhythm: Normal rate and regular rhythm.     Heart sounds: No murmur heard.   Pulmonary:     Effort: Pulmonary effort is normal. No respiratory distress.     Breath sounds: Normal breath sounds.  Abdominal:     Palpations: Abdomen is soft.     Tenderness: There is no abdominal tenderness. There is no guarding or rebound.  Musculoskeletal:        General: No tenderness.  Skin:    General: Skin is warm and dry.  Neurological:     Mental Status: She is alert and oriented to person, place, and time.  Psychiatric:     Comments: Anxious appearing     ED Results / Procedures / Treatments   Labs (all labs ordered are listed, but only abnormal results are displayed) Labs Reviewed  COMPREHENSIVE METABOLIC PANEL - Abnormal; Notable for the following components:      Result Value   Potassium 3.0 (*)    Glucose, Bld 110 (*)    All other components within normal limits  RAPID URINE DRUG SCREEN, HOSP PERFORMED - Abnormal; Notable for the following components:   Opiates POSITIVE (*)    Tetrahydrocannabinol POSITIVE (*)    All other components within normal limits  SARS CORONAVIRUS 2 BY RT PCR (HOSPITAL ORDER, [place] [facility])  CBC WITH DIFFERENTIAL/PLATELET  MAGNESIUM  LIPID PANEL  CBC  BASIC  METABOLIC PANEL  HIV ANTIBODY (ROUTINE TESTING W REFLEX)  TROPONIN I (HIGH SENSITIVITY)  TROPONIN I (HIGH SENSITIVITY)    EKG EKG Interpretation  Date/Time:  Monday February 11 2020 18:26:34 EDT Ventricular Rate:  73 PR Interval:    QRS Duration: 103 QT Interval:  411 QTC Calculation: 453 R Axis:   63 Text Interpretation: Sinus rhythm Borderline repolarization abnormality Confirmed by Quintella Reichert [phone]) on 02/11/2020 7:54:01 PM   Radiology DG Chest Port 1 View  Result Date: 02/11/2020 CLINICAL DATA:  Chest pain and shortness of breath. EXAM: PORTABLE CHEST 1 VIEW COMPARISON:  Radiograph 03/16/2017  FINDINGS: The cardiomediastinal contours are normal. The lungs are clear. Pulmonary vasculature is normal. No consolidation, pleural effusion, or pneumothorax. No acute osseous abnormalities are seen. IMPRESSION: Unremarkable portable AP view of the chest. Electronically Signed   By: Keith Rake M.D.   On: 02/11/2020 18:24    Procedures Procedures (including critical care time)  Medications Ordered in ED Medications  nitroGLYCERIN (NITROSTAT) SL tablet 0.4 mg (0.4 mg Sublingual Given 02/11/20 2153)  acetaminophen (TYLENOL) tablet 650 mg (has no administration in time range)  ondansetron (ZOFRAN) injection 4 mg (has no administration in time range)  enoxaparin (LOVENOX) injection 40 mg (40 mg Subcutaneous Given 02/11/20 2304)  aspirin EC tablet 81 mg (has no administration in time range)  busPIRone (BUSPAR) tablet 15 mg (15 mg Oral Given 02/11/20 2303)  divalproex (DEPAKOTE ER) 24 hr tablet 500 mg (500 mg Oral Given 02/11/20 2304)  hydrOXYzine (ATARAX/VISTARIL) tablet 50 mg (50 mg Oral Given 02/11/20 2303)  losartan (COZAAR) tablet 25 mg (has no administration in time range)  pregabalin (LYRICA) capsule 100 mg (100 mg Oral Given 02/11/20 2303)  rosuvastatin (CRESTOR) tablet 5 mg (5 mg Oral Given 02/11/20 2305)  nicotine (NICODERM CQ - dosed in mg/24 hours) patch 21 mg (has no  administration in time range)  morphine 2 MG/ML injection 1 mg (has no administration in time range)  aspirin chewable tablet 324 mg (324 mg Oral Given 02/11/20 1811)  potassium chloride SA (KLOR-CON) CR tablet 40 mEq (40 mEq Oral Given 02/11/20 1945)  potassium chloride 20 MEQ/15ML (10%) solution 40 mEq (40 mEq Oral Given 02/11/20 2304)    ED Course  I have reviewed the triage vital signs and the nursing notes.  Pertinent labs & imaging results that were available during my care of the patient were reviewed by me and considered in my medical decision making (see chart for details).    MDM Rules/Calculators/A&P                         Patient with history of hypertension, hyperlipidemia, tobacco use here for evaluation of chest pain. EKG with ST depression, increase when compared to priors. Initial troponin is negative. Her pain resolved after one sublingual nitroglycerin administration. Discussed the case with Dr. Radford Pax, with cardiology - recommends medicine admission and cardiology consult. Patient updated of findings of studies recommendation for admission and she is in agreement with treatment plan. Hospitalist consulted for admission.  Final Clinical Impression(s) / ED Diagnoses Final diagnoses:  Precordial pain  Hypokalemia    Rx / DC Orders ED Discharge Orders    None       Quintella Reichert, MD 02/12/20 0113

## 2020-02-11 NOTE — H&P (Signed)
History and Physical    Allison Kramer LOV:[id number] DOB: 09-29-1970 DOA: 02/11/2020  PCP: Christiane Ha, FNP  Patient coming from: Home  I have personally briefly reviewed patient's old medical records in [place]  Chief Complaint: Chest pain  HPI: Allison Kramer is a 49 y.o. female with medical history significant for hypertension, hyperlipidemia, mood disorder, tobacco use, substance use disorder who presents to the ED for evaluation of chest pain.  Patient states she has had recent intermittent stabbing substernal chest pain.  She would have associated diaphoresis with shortness of breath.  It would last several minutes before resolving on its own.  She was seen by cardiology, Dr. Harriet Masson for her symptoms on 02/06/2020.  She was started on losartan for hypertension and rosuvastatin for hyperlipidemia and is scheduled to undergo echocardiogram and stress testing later this month.  Patient states today 02/11/2020 she was eating a [employer] cafeteria when she had sudden onset of heavy/pressure sensation across her lower sternum.  She had radiation of discomfort to her left jaw and to her right shoulder.  She had associated "fluttering" sensation with some shortness of breath.  She had nausea without vomiting.  She had mild lightheadedness without syncope.  She denied any diaphoresis at this time.  She came to the ED for evaluation at which time she had continued 10 out of 10 pain.  She was given sublingual nitroglycerin with complete resolution, now reporting 0 out of 10 pain.  She does report smoking 0.5-1 pack of cigarettes per day for the last 12 years.  She denies any recent alcohol use.  She reports occasional marijuana use.  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  ED Course:  Initial vitals showed BP 177/98, pulse 62, RR 20, temp 98.2 Fahrenheit, SPO2 96% on room air.  Labs are notable for  sodium 143, potassium 3.0, magnesium 2.1, bicarb 24, BUN 14, creatinine 0.65, serum glucose 110, LFTs within normal limits, WBC 6.6, hemoglobin 13.3, platelets 320,000, high-sensitivity troponin I 3x2.  SARS-CoV-2 PCR is negative.  UDS is ordered and pending.  Normal chest x-ray is negative for focal consolidation, effusion, or pneumothorax.  Patient was given 40 mEq oral potassium, aspirin 324 mg, and 1 dose of sublingual nitroglycerin with resolution of pain.  EDP discussed with on-call cardiology who recommended medical admission at [facility] for further chest pain work-up with possible stress testing.  The hospitalist service was consulted to admit for further evaluation and management.  Review of Systems: All systems reviewed and are negative except as documented in history of present illness above.   Past Medical History:  Diagnosis Date  . Anxiety   . Asthma   . Drug abuse ([place])   . Hepatitis   . Hyperlipidemia   . Hypertension   . Seizures ([place])     History reviewed. No pertinent surgical history.  Social History:  reports that she has been smoking. She has never used smokeless tobacco. She reports previous alcohol use. She reports current drug use.  Allergies  Allergen Reactions  . Cephalexin Anaphylaxis  . Doxycycline Anaphylaxis  . Erythromycin Base Anaphylaxis  . Penicillins Anaphylaxis  . Carbamazepine Nausea And Vomiting    "They told me to not take it again"   . Clonidine Other (See Comments)    unknown  . Lithium Nausea And Vomiting    "I just went toxic on it"   .  Oxcarbazepine Nausea And Vomiting    "They told me not to take it again"   . Sulfamethoxazole Nausea And Vomiting  . Erythromycin Rash  . Latex Rash    Family History  Problem Relation Age of Onset  . Cancer Mother   . Cancer Father   . Cancer Brother      Prior to Admission medications   Medication Sig Start Date End Date Taking? Authorizing Provider  busPIRone  (BUSPAR) 15 MG tablet Take by mouth. 10/26/19   [provider]  Cetirizine HCl 10 MG CAPS     [provider]  divalproex (DEPAKOTE ER) 500 MG 24 hr tablet Take by mouth. 03/18/17   [provider]  hydrOXYzine (ATARAX/VISTARIL) 50 MG tablet Take by mouth. 10/26/19   [provider]  losartan (COZAAR) 25 MG tablet Take 1 tablet (25 mg total) by mouth daily. 02/06/20 05/06/20  Tobb, Kardie, DO  mirtazapine (REMERON) 15 MG tablet Take 15 mg by mouth as needed (sleep).  10/26/19   [provider]  nitroGLYCERIN (NITROSTAT) 0.4 MG SL tablet Place 1 tablet (0.4 mg total) under the tongue every 5 (five) minutes as needed for chest pain. 02/06/20 05/06/20  Tobb, Kardie, DO  phenylephrine (SUDAFED PE) 10 MG TABS tablet Take 10 mg by mouth every 6 (six) hours as needed.     [provider]  rosuvastatin (CRESTOR) 5 MG tablet Take 1 tablet (5 mg total) by mouth daily. 02/06/20 05/06/20  Tobb, Kardie, DO  valACYclovir (VALTREX) 1000 MG tablet Take by mouth.    [provider]    Physical Exam: Vitals:   02/11/20 1945 02/11/20 2018 02/11/20 2028 02/11/20 2136  BP: 138/78 121/61 131/64 (!) 142/85  Pulse: (!) 55 (!) 57 (!) 49 (!) 57  Resp: (!) 22 20 15 16   Temp:      TempSrc:      SpO2: 96% 96% 96% 97%  Weight:      Height:       Constitutional: Resting supine in bed, NAD, calm, comfortable Eyes: PERRL, lids and conjunctivae normal ENMT: Mucous membranes are moist. Posterior pharynx clear of any exudate or lesions.poor dentition.  Neck: normal, supple, no masses. Respiratory: clear to auscultation bilaterally, no wheezing, no crackles. Normal respiratory effort. No accessory muscle use.  Cardiovascular: Regular rate and rhythm, no murmurs / rubs / gallops. No extremity edema. 2+ pedal pulses. Abdomen: no tenderness, no masses palpated. No hepatosplenomegaly. Bowel sounds positive.  Musculoskeletal: no clubbing / cyanosis. No joint deformity upper and  lower extremities. Good ROM, no contractures. Normal muscle tone.  Skin: no rashes, lesions, ulcers. No induration Neurologic: CN 2-12 grossly intact. Sensation intact, Strength 5/5 in all 4.  Psychiatric: Normal judgment and insight. Alert and oriented x 3. Normal mood.     Labs on Admission: I have personally reviewed following labs and imaging studies  CBC: Recent Labs  Lab 02/11/20 1806  WBC 6.6  NEUTROABS 2.4  HGB 13.3  HCT 39.6  MCV 92.5  PLT 875   Basic Metabolic Panel: Recent Labs  Lab 02/11/20 1806 02/11/20 1948  NA 143  --   K 3.0*  --   CL 106  --   CO2 24  --   GLUCOSE 110*  --   BUN 14  --   CREATININE 0.65  --   CALCIUM 9.3  --   MG  --  2.1   GFR: Estimated Creatinine Clearance: 69.6 mL/min (by C-G formula based on SCr  of 0.65 mg/dL). Liver Function Tests: Recent Labs  Lab 02/11/20 1806  AST 16  ALT 13  ALKPHOS 65  BILITOT 0.7  PROT 7.9  ALBUMIN 4.7   No results for input(s): LIPASE, AMYLASE in the last 168 hours. No results for input(s): AMMONIA in the last 168 hours. Coagulation Profile: No results for input(s): INR, PROTIME in the last 168 hours. Cardiac Enzymes: No results for input(s): CKTOTAL, CKMB, CKMBINDEX, TROPONINI in the last 168 hours. BNP (last 3 results) No results for input(s): PROBNP in the last 8760 hours. HbA1C: No results for input(s): HGBA1C in the last 72 hours. CBG: No results for input(s): GLUCAP in the last 168 hours. Lipid Profile: No results for input(s): CHOL, HDL, LDLCALC, TRIG, CHOLHDL, LDLDIRECT in the last 72 hours. Thyroid Function Tests: No results for input(s): TSH, T4TOTAL, FREET4, T3FREE, THYROIDAB in the last 72 hours. Anemia Panel: No results for input(s): VITAMINB12, FOLATE, FERRITIN, TIBC, IRON, RETICCTPCT in the last 72 hours. Urine analysis: No results found for: COLORURINE, APPEARANCEUR, LABSPEC, [place], GLUCOSEU, HGBUR, BILIRUBINUR, KETONESUR, PROTEINUR, UROBILINOGEN, NITRITE,  LEUKOCYTESUR  Radiological Exams on Admission: DG Chest Port 1 View  Result Date: 02/11/2020 CLINICAL DATA:  Chest pain and shortness of breath. EXAM: PORTABLE CHEST 1 VIEW COMPARISON:  Radiograph 03/16/2017 FINDINGS: The cardiomediastinal contours are normal. The lungs are clear. Pulmonary vasculature is normal. No consolidation, pleural effusion, or pneumothorax. No acute osseous abnormalities are seen. IMPRESSION: Unremarkable portable AP view of the chest. Electronically Signed   By: Keith Rake M.D.   On: 02/11/2020 18:24    EKG: Independently reviewed. Normal sinus rhythm, nonspecific T wave changes leads V2-V3.  Previous EKG showed sinus arrhythmia.  Assessment/Plan Principal Problem:   Chest pain Active Problems:   Essential hypertension   Tobacco use   Mixed hyperlipidemia   Hypokalemia   Substance use disorder   Mood disorder (HCC)  Allison Kramer is a 49 y.o. female with medical history significant for hypertension, hyperlipidemia, mood disorder, tobacco use, substance use disorder who is admitted for chest pain rule out.  Chest pain: Patient with midsternal heavy pressure like chest pain occurring while eating.  Discomfort radiated to the left jaw anf right shoulder before resolving completely with sublingual nitroglycerin.  Has been seen by cardiology recently for previous intermittent chest pain episodes with plans for further ischemic work-up.  EKG with mild nonspecific ST changes, troponin I negative x2. -EDP discussed with on-call cardiology who recommended admission to [facility] and they will follow tomorrow -Troponin negative x2 -Monitor on telemetry, repeat EKG in a.m. -Obtain echocardiogram -Keep n.p.o. after midnight for possible stress testing -Continue aspirin 81 mg daily for now and sublingual nitroglycerin as needed  Hypertension: Resume losartan.  Hyperlipidemia: Continue rosuvastatin.  Hypokalemia: Replete orally and recheck  labs in a.m.  Substance use disorder: Patient reports taking oral morphine daily for the last year.  Monitor with clinical opioid withdrawal scale.  Tobacco use: Currently smoking 0.5-1.0 packs/day.  Order nicotine patch.  Mood disorder: Continue BuSpar, Depakote, hydroxyzine.  DVT prophylaxis: Lovenox Code Status: Full code, confirmed with patient Family Communication: Discussed with patient's acquaintance at bedside Disposition Plan: From home and likely discharge to home pending chest pain rule out Consults called: EDP discussed with on-call cardiology Admission status:  Status is: Observation  The patient remains OBS appropriate and will d/c before 2 midnights.  Dispo: The patient is from: Home              Anticipated d/c  is to: Home              Anticipated d/c date is: 1 day pending chest pain work-up.              Patient currently is not medically stable to d/c.   Zada Finders MD [facility]  If 7PM-7AM, please contact night-coverage [email or web address]  02/11/2020, 9:42 PM

## 2020-02-11 NOTE — ED Notes (Signed)
Pt significant other out to desk stating pt is having chest pain. On assessment, pt states she was resting/laughing and had onset of "pressure" in the center of her chest 4/10, pain in the jaw, says she felt lightheaded, sweaty and nausea. EKG obtained, given to Dr. Posey Pronto. VSS. Nitro administered.

## 2020-02-12 ENCOUNTER — Encounter (HOSPITAL_COMMUNITY)
Admission: EM | Disposition: A | Discharge status: Home / Self Care | Payer: Self-pay | Source: Home / Self Care | Attending: Emergency Medicine

## 2020-02-12 ENCOUNTER — Other Ambulatory Visit: Payer: Self-pay

## 2020-02-12 ENCOUNTER — Other Ambulatory Visit (HOSPITAL_COMMUNITY): Payer: Medicaid Other

## 2020-02-12 ENCOUNTER — Observation Stay (HOSPITAL_BASED_OUTPATIENT_CLINIC_OR_DEPARTMENT_OTHER): Payer: Self-pay

## 2020-02-12 ENCOUNTER — Encounter (HOSPITAL_COMMUNITY): Payer: Self-pay | Admitting: Internal Medicine

## 2020-02-12 DIAGNOSIS — E782 Mixed hyperlipidemia: Secondary | ICD-10-CM

## 2020-02-12 DIAGNOSIS — I200 Unstable angina: Secondary | ICD-10-CM

## 2020-02-12 DIAGNOSIS — I10 Essential (primary) hypertension: Secondary | ICD-10-CM

## 2020-02-12 DIAGNOSIS — R079 Chest pain, unspecified: Secondary | ICD-10-CM

## 2020-02-12 HISTORY — PX: LEFT HEART CATH AND CORONARY ANGIOGRAPHY: CATH118249

## 2020-02-12 LAB — CBC
HCT: 34.8 % — ABNORMAL LOW (ref 36.0–46.0)
Hemoglobin: 11.9 g/dL — ABNORMAL LOW (ref 12.0–15.0)
MCH: 31.6 pg (ref 26.0–34.0)
MCHC: 34.2 g/dL (ref 30.0–36.0)
MCV: 92.3 fL (ref 80.0–100.0)
Platelets: 266 10*3/uL (ref 150–400)
RBC: 3.77 MIL/uL — ABNORMAL LOW (ref 3.87–5.11)
RDW: 14.2 % (ref 11.5–15.5)
WBC: 5.3 10*3/uL (ref 4.0–10.5)
nRBC: 0 % (ref 0.0–0.2)

## 2020-02-12 LAB — LIPID PANEL
Cholesterol: 212 mg/dL — ABNORMAL HIGH (ref 0–200)
HDL: 32 mg/dL — ABNORMAL LOW (ref >40)
LDL Cholesterol: 139 mg/dL — ABNORMAL HIGH (ref 0–99)
Total CHOL/HDL Ratio: 6.6 RATIO
Triglycerides: 203 mg/dL — ABNORMAL HIGH (ref <150)
VLDL: 41 mg/dL — ABNORMAL HIGH (ref 0–40)

## 2020-02-12 LAB — HIV ANTIBODY (ROUTINE TESTING W REFLEX): HIV Screen 4th Generation wRfx: NONREACTIVE

## 2020-02-12 LAB — BASIC METABOLIC PANEL
Anion gap: 6 (ref 5–15)
BUN: 12 mg/dL (ref 6–20)
CO2: 26 mmol/L (ref 22–32)
Calcium: 8.8 mg/dL — ABNORMAL LOW (ref 8.9–10.3)
Chloride: 109 mmol/L (ref 98–111)
Creatinine, Ser: 0.4 mg/dL — ABNORMAL LOW (ref 0.44–1.00)
GFR calc Af Amer: >60 mL/min (ref >60)
GFR calc non Af Amer: >60 mL/min (ref >60)
Glucose, Bld: 101 mg/dL — ABNORMAL HIGH (ref 70–99)
Potassium: 4.2 mmol/L (ref 3.5–5.1)
Sodium: 141 mmol/L (ref 135–145)

## 2020-02-12 LAB — ECHOCARDIOGRAM COMPLETE
Height: 63 in
Weight: 1990.4 oz

## 2020-02-12 LAB — PREGNANCY, URINE: Preg Test, Ur: NEGATIVE

## 2020-02-12 SURGERY — LEFT HEART CATH AND CORONARY ANGIOGRAPHY
Anesthesia: LOCAL

## 2020-02-12 MED ORDER — HEPARIN (PORCINE) IN NACL 1000-0.9 UT/500ML-% IV SOLN
INTRAVENOUS | Status: DC | PRN
  Administered 2020-02-12 (×2): 500 mL

## 2020-02-12 MED ORDER — IOHEXOL 350 MG/ML SOLN
INTRAVENOUS | Status: DC | PRN
  Administered 2020-02-12: 50 mL

## 2020-02-12 MED ORDER — ONDANSETRON HCL 4 MG/2ML IJ SOLN: 4.0000 mg | Freq: Four times a day (QID) | INTRAMUSCULAR | Status: DC | PRN

## 2020-02-12 MED ORDER — HEPARIN (PORCINE) IN NACL 1000-0.9 UT/500ML-% IV SOLN
INTRAVENOUS | Status: AC
  Filled 2020-02-12: qty 1000

## 2020-02-12 MED ORDER — NITROGLYCERIN 0.4 MG SL SUBL: 0.4000 mg | SUBLINGUAL_TABLET | SUBLINGUAL | Status: DC | PRN

## 2020-02-12 MED ORDER — LORATADINE 10 MG PO TABS: 10.0000 mg | ORAL_TABLET | Freq: Every day | ORAL | Status: DC

## 2020-02-12 MED ORDER — ACETAMINOPHEN 325 MG PO TABS: 650.0000 mg | ORAL_TABLET | ORAL | Status: DC | PRN

## 2020-02-12 MED ORDER — LABETALOL HCL 5 MG/ML IV SOLN: 10.0000 mg | INTRAVENOUS | Status: DC | PRN

## 2020-02-12 MED ORDER — MIDAZOLAM HCL 2 MG/2ML IJ SOLN
INTRAMUSCULAR | Status: DC | PRN
  Administered 2020-02-12: 2 mg via INTRAVENOUS

## 2020-02-12 MED ORDER — LIDOCAINE HCL (PF) 1 % IJ SOLN
INTRAMUSCULAR | Status: DC | PRN
  Administered 2020-02-12: 2 mL

## 2020-02-12 MED ORDER — SODIUM CHLORIDE 0.9 % IV SOLN: INTRAVENOUS | Status: DC

## 2020-02-12 MED ORDER — MIRTAZAPINE 15 MG PO TABS
15.0000 mg | ORAL_TABLET | Freq: Every evening | ORAL | Status: DC | PRN
  Filled 2020-02-12: qty 1

## 2020-02-12 MED ORDER — LIDOCAINE HCL (PF) 1 % IJ SOLN
INTRAMUSCULAR | Status: AC
  Filled 2020-02-12: qty 30

## 2020-02-12 MED ORDER — FENTANYL CITRATE (PF) 100 MCG/2ML IJ SOLN
INTRAMUSCULAR | Status: DC | PRN
  Administered 2020-02-12: 25 ug via INTRAVENOUS

## 2020-02-12 MED ORDER — HEPARIN SODIUM (PORCINE) 1000 UNIT/ML IJ SOLN
INTRAMUSCULAR | Status: AC
  Filled 2020-02-12: qty 1

## 2020-02-12 MED ORDER — SODIUM CHLORIDE 0.9 % IV SOLN: INTRAVENOUS | Status: AC

## 2020-02-12 MED ORDER — MIDAZOLAM HCL 2 MG/2ML IJ SOLN
INTRAMUSCULAR | Status: AC
  Filled 2020-02-12: qty 2

## 2020-02-12 MED ORDER — VALACYCLOVIR HCL 500 MG PO TABS: 1000.0000 mg | ORAL_TABLET | Freq: Every day | ORAL | Status: DC

## 2020-02-12 MED ORDER — VERAPAMIL HCL 2.5 MG/ML IV SOLN
INTRAVENOUS | Status: AC
  Filled 2020-02-12: qty 2

## 2020-02-12 MED ORDER — HEPARIN SODIUM (PORCINE) 1000 UNIT/ML IJ SOLN
INTRAMUSCULAR | Status: DC | PRN
  Administered 2020-02-12: 3000 [IU] via INTRAVENOUS

## 2020-02-12 MED ORDER — SODIUM CHLORIDE 0.9% FLUSH: 3.0000 mL | INTRAVENOUS | Status: DC | PRN

## 2020-02-12 MED ORDER — SODIUM CHLORIDE 0.9% FLUSH: 3.0000 mL | Freq: Two times a day (BID) | INTRAVENOUS | Status: DC

## 2020-02-12 MED ORDER — ASPIRIN 81 MG PO CHEW: 81.0000 mg | CHEWABLE_TABLET | ORAL | Status: DC

## 2020-02-12 MED ORDER — SODIUM CHLORIDE 0.9 % IV SOLN: 250.0000 mL | INTRAVENOUS | Status: DC | PRN

## 2020-02-12 MED ORDER — HYDRALAZINE HCL 20 MG/ML IJ SOLN: 10.0000 mg | INTRAMUSCULAR | Status: DC | PRN

## 2020-02-12 MED ORDER — VERAPAMIL HCL 2.5 MG/ML IV SOLN
INTRAVENOUS | Status: DC | PRN
  Administered 2020-02-12: 10 mL via INTRA_ARTERIAL

## 2020-02-12 MED ORDER — NICOTINE 21 MG/24HR TD PT24: 21.0000 mg | MEDICATED_PATCH | Freq: Every day | TRANSDERMAL | 0 refills | Status: AC

## 2020-02-12 MED ORDER — FENTANYL CITRATE (PF) 100 MCG/2ML IJ SOLN
INTRAMUSCULAR | Status: AC
  Filled 2020-02-12: qty 2

## 2020-02-12 SURGICAL SUPPLY — 11 items
CATH 5FR JL3.5 JR4 ANG PIG MP (CATHETERS) ×1 IMPLANT
DEVICE RAD COMP TR BAND LRG (VASCULAR PRODUCTS) ×1 IMPLANT
GLIDESHEATH SLEND SS 6F .021 (SHEATH) ×1 IMPLANT
GUIDEWIRE INQWIRE 1.5J.035X260 (WIRE) IMPLANT
INQWIRE 1.5J .035X260CM (WIRE) ×2
KIT HEART LEFT (KITS) ×2 IMPLANT
KIT SINGLE USE MANIFOLD (KITS) ×1 IMPLANT
PACK CARDIAC CATHETERIZATION (CUSTOM PROCEDURE TRAY) ×2 IMPLANT
SHEATH PROBE COVER 6X72 (BAG) ×1 IMPLANT
TRANSDUCER W/STOPCOCK (MISCELLANEOUS) ×2 IMPLANT
TUBING CIL FLEX 10 FLL-RA (TUBING) ×2 IMPLANT

## 2020-02-12 NOTE — Progress Notes (Signed)
  Echocardiogram 2D Echocardiogram has been performed.  Allison Kramer 02/12/2020, 1:42 PM

## 2020-02-12 NOTE — Interval H&P Note (Signed)
Cath Lab Visit (complete for each Cath Lab visit)  Clinical Evaluation Leading to the Procedure:   ACS: Yes.    Non-ACS:    Anginal Classification: CCS IV  Anti-ischemic medical therapy: Minimal Therapy (1 class of medications)  Non-Invasive Test Results: No non-invasive testing performed  Prior CABG: No previous CABG      History and Physical Interval Note:  02/12/2020 3:36 PM  Allison Kramer Meth  has presented today for surgery, with the diagnosis of unstable angina.  The various methods of treatment have been discussed with the patient and family. After consideration of risks, benefits and other options for treatment, the patient has consented to  Procedure(s): LEFT HEART CATH AND CORONARY ANGIOGRAPHY (N/A) as a surgical intervention.  The patient's history has been reviewed, patient examined, no change in status, stable for surgery.  I have reviewed the patient's chart and labs.  Questions were answered to the patient's satisfaction.     [name]

## 2020-02-12 NOTE — H&P (View-Only) (Signed)
Cardiology Consultation:   Patient ID: Allison Kramer MRN: [id number]; DOB: [birth year]  Admit date: 02/11/2020 Date of Consult: 02/12/2020  Primary Care Provider: Christiane Ha, FNP [facility] Cardiologist: Berniece Salines, DO  [facility] Electrophysiologist:  None    Patient Profile:   Allison Kramer is a 49 y.o. female with a hx of HTN, HLD, seizures, hepatitis and recently seen by cards for chest pain and SOB, +tobacco hx,  who is being seen today for the evaluation of chest pain at the request of Dr Barth Kirks.  History of Present Illness:   Allison Kramer with above hx and was seen by Dr. Harriet Masson for HTN, uncontrolled, chest pain mid sternal begins mid chest and radiates to Rt arm then down shoulders and into upper jaw.  Nuc stress test and echo were ordered but not yet done.  She was placed on losartan and crestor. She presented to ER yest evening with chest pain that began at [employer].  Central chest pain with radiation to jaw and Rt arm.  + SOB and was initially 10/10  It was more dull in nature than prior episodes.  She was given 324 mg ASA  2 NTG sl were given.  NTG resolved pain.      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    EKG:  The EKG was personally reviewed and demonstrates:  SR at 51 with ST abnormality in inf lateral leads more pronounced form 02/11/20 and follow up EKGs were reviewed and ST changes imrpoved Telemetry:  Telemetry was personally reviewed and demonstrates:  SR to SB in 40s freq,  rare to 39   Hs troponin 3, 3  Na 143, K+ 3.0, BUN 14, Cr 0.65  Today K+ is 4.2  Tchol 212, HDL 32 and LDL 139 TG 203  Hgb 11.9 WBC 5.3 plts 266 UDS with opiates and marijuana   PCXR WNL  Past Medical History:  Diagnosis Date  . Anxiety   . Asthma   . Drug abuse ([place])   . Hepatitis   . Hyperlipidemia   . Hypertension   . Seizures ([place])     History reviewed. No pertinent surgical history.   Home Medications:  Prior to  Admission medications   Medication Sig Start Date End Date Taking? Authorizing Provider  busPIRone (BUSPAR) 15 MG tablet Take 15 mg by mouth 3 (three) times daily.  10/26/19  Yes [provider]  Cetirizine HCl 10 MG CAPS Take 10 mg by mouth daily.    Yes [provider]  divalproex (DEPAKOTE ER) 500 MG 24 hr tablet Take 500 mg by mouth daily.  03/18/17  Yes [provider]  hydrOXYzine (ATARAX/VISTARIL) 50 MG tablet Take 50 mg by mouth in the morning, at noon, and at bedtime.  10/26/19  Yes [provider]  ibuprofen (ADVIL) 200 MG tablet Take 400 mg by mouth every 6 (six) hours as needed for moderate pain.   Yes [provider]  losartan (COZAAR) 25 MG tablet Take 1 tablet (25 mg total) by mouth daily. 2020-02-11 05/06/20 Yes Tobb, Kardie, DO  mirtazapine (REMERON) 15 MG tablet Take 15 mg by mouth as needed (sleep).  10/26/19  Yes [provider]  nitroGLYCERIN (NITROSTAT) 0.4 MG SL tablet Place 1 tablet (0.4 mg total) under the tongue every 5 (five) minutes as needed for chest pain. February 11, 2020 05/06/20 Yes Tobb, Kardie, DO  OVER THE COUNTER MEDICATION Take 2 tablets by mouth daily.  Amberen   Yes [provider]  phenylephrine (SUDAFED PE) 10 MG TABS tablet Take 10 mg by mouth in the morning and at bedtime.    Yes [provider]  pregabalin (LYRICA) 100 MG capsule Take 100 mg by mouth 2 (two) times daily.   Yes [provider]  rosuvastatin (CRESTOR) 5 MG tablet Take 1 tablet (5 mg total) by mouth daily. 02/06/20 05/06/20 Yes Tobb, Kardie, DO  valACYclovir (VALTREX) 1000 MG tablet Take 1,000 mg by mouth daily.    Yes [provider]    Inpatient Medications: Scheduled Meds: . aspirin EC  81 mg Oral Daily  . busPIRone  15 mg Oral TID  . divalproex  500 mg Oral Daily  . enoxaparin (LOVENOX) injection  40 mg Subcutaneous Q24H  . hydrOXYzine  50 mg Oral TID  . losartan  25 mg Oral Daily  . nicotine  21 mg Transdermal Daily    . pregabalin  100 mg Oral BID  . rosuvastatin  5 mg Oral Daily   Continuous Infusions:  PRN Meds: acetaminophen, morphine injection, nitroGLYCERIN, ondansetron (ZOFRAN) IV  Allergies:    Allergies  Allergen Reactions  . Cephalexin Anaphylaxis  . Doxycycline Anaphylaxis  . Erythromycin Base Anaphylaxis  . Penicillins Anaphylaxis  . Carbamazepine Nausea And Vomiting    "They told me to not take it again"   . Clonidine Other (See Comments)    unknown  . Lithium Nausea And Vomiting    "I just went toxic on it"   . Oxcarbazepine Nausea And Vomiting    "They told me not to take it again"   . Sulfamethoxazole Nausea And Vomiting  . Erythromycin Rash  . Latex Rash    Social History:   Social History   Socioeconomic History  . Marital status: Single    Spouse name: Not on file  . Number of children: Not on file  . Years of education: Not on file  . Highest education level: Not on file  Occupational History  . Not on file  Tobacco Use  . Smoking status: Current Every Day Smoker  . Smokeless tobacco: Never Used  Substance and Sexual Activity  . Alcohol use: Not Currently  . Drug use: Yes  . Sexual activity: Not on file  Other Topics Concern  . Not on file  Social History Narrative  . Not on file   Social Determinants of Health   Financial Resource Strain:   . Difficulty of Paying Living Expenses:   Food Insecurity:   . Worried About Charity fundraiser in the Last Year:   . Arboriculturist in the Last Year:   Transportation Needs:   . Film/video editor (Medical):   Marland Kitchen Lack of Transportation (Non-Medical):   Physical Activity:   . Days of Exercise per Week:   . Minutes of Exercise per Session:   Stress:   . Feeling of Stress :   Social Connections:   . Frequency of Communication with Friends and Family:   . Frequency of Social Gatherings with Friends and Family:   . Attends Religious Services:   . Active Member of Clubs or Organizations:   . Attends  Archivist Meetings:   Marland Kitchen Marital Status:   Intimate Partner Violence:   . Fear of Current or Ex-Partner:   . Emotionally Abused:   Marland Kitchen Physically Abused:   . Sexually Abused:     Family History:    Family History  Problem Relation  Age of Onset  . Cancer Mother   . Cancer Father   . Cancer Brother      ROS:  Please see the history of present illness.  General:no colds or fevers, no weight changes Skin:no rashes or ulcers HEENT:no blurred vision, no congestion CV:see HPI PUL:see HPI GI:no diarrhea constipation or melena, no indigestion GU:no hematuria, no dysuria MS:no joint pain, no claudication Neuro:no syncope, no lightheadedness Endo:no diabetes, no thyroid disease  All other ROS reviewed and negative.     Physical Exam/Data:   Vitals:   02/12/20 0400 02/12/20 0519 02/12/20 0700 02/12/20 0815  BP: 117/67 (!) 100/56 (!) 148/91 133/80  Pulse: (!) 44 (!) 46 (!) 49 (!) 45  Resp: 16 13 16 16   Temp:  97.9 F (36.6 C)    TempSrc:  Oral    SpO2: 96% 100% 100% 96%  Weight:      Height:       No intake or output data in the 24 hours ending 02/12/20 0847 Last 3 Weights 02/11/2020 02/06/2020  Weight (lbs) 129 lb 129 lb 3.2 oz  Weight (kg) 58.514 kg 58.605 kg     Body mass index is 22.85 kg/m.  Exam by Dr. Margaretann Loveless   Relevant CV Studies: none  Laboratory Data:  High Sensitivity Troponin:   Recent Labs  Lab 02/11/20 1806 02/11/20 1948  TROPONINIHS 3 3     Chemistry Recent Labs  Lab 02/11/20 1806 02/12/20 0522  NA 143 141  K 3.0* 4.2  CL 106 109  CO2 24 26  GLUCOSE 110* 101*  BUN 14 12  CREATININE 0.65 0.40*  CALCIUM 9.3 8.8*  GFRNONAA >60 >60  GFRAA >60 >60  ANIONGAP 13 6    Recent Labs  Lab 02/11/20 1806  PROT 7.9  ALBUMIN 4.7  AST 16  ALT 13  ALKPHOS 65  BILITOT 0.7   Hematology Recent Labs  Lab 02/11/20 1806 02/12/20 0522  WBC 6.6 5.3  RBC 4.28 3.77*  HGB 13.3 11.9*  HCT 39.6 34.8*  MCV 92.5 92.3  MCH 31.1 31.6  MCHC  33.6 34.2  RDW 14.1 14.2  PLT 320 266   BNPNo results for input(s): BNP, PROBNP in the last 168 hours.  DDimer No results for input(s): DDIMER in the last 168 hours.   Radiology/Studies:  [facility] Chest Port 1 View  Result Date: 02/11/2020 CLINICAL DATA:  Chest pain and shortness of breath. EXAM: PORTABLE CHEST 1 VIEW COMPARISON:  Radiograph 03/16/2017 FINDINGS: The cardiomediastinal contours are normal. The lungs are clear. Pulmonary vasculature is normal. No consolidation, pleural effusion, or pneumothorax. No acute osseous abnormalities are seen. IMPRESSION: Unremarkable portable AP view of the chest. Electronically Signed   By: Keith Rake M.D.   On: 02/11/2020 18:24       HEAR Score (for undifferentiated chest pain):     6   Assessment and Plan:   1. Chest pain - with normal troponin, but relief with SL NTG - severe pain, + hx of HTN and HLD.  Now on medication neg troponin but with severity of symptoms would do cardiac CTA or cardiac cath.  If all normal then would consider esophageal spasm with NTG resolving pain.   2. HTN  Elevated on admit 177/98 improved after NTG now back on losartan  3. HLD on crestor 5 mg just started  4. Tobacco use on nicoderm       For questions or updates, please contact [place] Please consult [email or web address] for contact info  under    Signed, Cecilie Kicks, NP  02/12/2020 8:47 AM  Patient seen and examined with Cecilie Kicks NP.  Agree as above, with the following exceptions and changes as noted below.  She has had 4 episodes of chest discomfort.  3 of which felt like a knife stabbing her in the chest with jaw clenching and radiating pain to the right shoulder.  While sitting and eating it can [employer] she felt a pressure sensation in her chest that made her feel as if she had to belch. The pressure sensation worsened with time, and was subsequently relieved with nitroglycerin. Gen: NAD, CV: RRR, no murmurs, radial pulse 3/4 right radial.  Lungs: clear, Abd: soft, Extrem: Warm, well perfused, no edema, Neuro/Psych: alert and oriented x 3, normal mood and affect. All available labs, radiology testing, previous records reviewed.  Her chest pain is concerning for unstable angina, and I am concerned with her history of smoking and hyperlipidemia that this may represent ischemia.  In addition, I would want to exclude scad as an etiology for her chest pain given recurrences and presentation.  With an eye towards excluding scad, conventional coronary angiography would be the next best step.  I have consented the patient and participated in shared decision making.  She is willing to proceed.  Informed consent documented below.  INFORMED CONSENT: I have reviewed the risks, indications, and alternatives to cardiac catheterization, possible angioplasty, and stenting with the patient. Risks include but are not limited to bleeding, infection, vascular injury, stroke, myocardial infection, arrhythmia, kidney injury, radiation-related injury in the case of prolonged fluoroscopy use, emergency cardiac surgery, and death. The patient understands the risks of serious complication is 1-2 in 3817 with diagnostic cardiac cath and 1-2% or less with angioplasty/stenting.    Elouise Munroe, MD 02/12/20 11:28 AM

## 2020-02-12 NOTE — Progress Notes (Signed)
Patient returned from cath lab at 1612hrs.  Post cath instructions given.  TR band R wrist, level zero.

## 2020-02-12 NOTE — Consult Note (Addendum)
Cardiology Consultation:   Patient ID: Keyanna Sandefer MRN: [id number]; DOB: 02-03-1971  Admit date: 02/11/2020 Date of Consult: 02/12/2020  Primary Care Provider: Christiane Ha, FNP [facility] Cardiologist: Berniece Salines, DO  [facility] Electrophysiologist:  None    Patient Profile:   Jovanni Eckhart is a 49 y.o. female with a hx of HTN, HLD, seizures, hepatitis and recently seen by cards for chest pain and SOB, +tobacco hx,  who is being seen today for the evaluation of chest pain at the request of Dr Barth Kirks.  History of Present Illness:   Ms. Lampert with above hx and was seen by Dr. Harriet Masson for HTN, uncontrolled, chest pain mid sternal begins mid chest and radiates to Rt arm then down shoulders and into upper jaw.  Nuc stress test and echo were ordered but not yet done.  She was placed on losartan and crestor. She presented to ER yest evening with chest pain that began at [employer].  Central chest pain with radiation to jaw and Rt arm.  + SOB and was initially 10/10  It was more dull in nature than prior episodes.  She was given 324 mg ASA  2 NTG sl were given.  NTG resolved pain.      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    EKG:  The EKG was personally reviewed and demonstrates:  SR at 52 with ST abnormality in inf lateral leads more pronounced form March 03, 2020 and follow up EKGs were reviewed and ST changes imrpoved Telemetry:  Telemetry was personally reviewed and demonstrates:  SR to SB in 40s freq,  rare to 39   Hs troponin 3, 3  Na 143, K+ 3.0, BUN 14, Cr 0.65  Today K+ is 4.2  Tchol 212, HDL 32 and LDL 139 TG 203  Hgb 11.9 WBC 5.3 plts 266 UDS with opiates and marijuana   PCXR WNL  Past Medical History:  Diagnosis Date   Anxiety    Asthma    Drug abuse ([place])    Hepatitis    Hyperlipidemia    Hypertension    Seizures ([place])     History reviewed. No pertinent surgical history.   Home Medications:  Prior to  Admission medications   Medication Sig Start Date End Date Taking? Authorizing Provider  busPIRone (BUSPAR) 15 MG tablet Take 15 mg by mouth 3 (three) times daily.  10/26/19  Yes [provider]  Cetirizine HCl 10 MG CAPS Take 10 mg by mouth daily.    Yes [provider]  divalproex (DEPAKOTE ER) 500 MG 24 hr tablet Take 500 mg by mouth daily.  03/18/17  Yes [provider]  hydrOXYzine (ATARAX/VISTARIL) 50 MG tablet Take 50 mg by mouth in the morning, at noon, and at bedtime.  10/26/19  Yes [provider]  ibuprofen (ADVIL) 200 MG tablet Take 400 mg by mouth every 6 (six) hours as needed for moderate pain.   Yes [provider]  losartan (COZAAR) 25 MG tablet Take 1 tablet (25 mg total) by mouth daily. March 03, 2020 05/06/20 Yes Tobb, Kardie, DO  mirtazapine (REMERON) 15 MG tablet Take 15 mg by mouth as needed (sleep).  10/26/19  Yes [provider]  nitroGLYCERIN (NITROSTAT) 0.4 MG SL tablet Place 1 tablet (0.4 mg total) under the tongue every 5 (five) minutes as needed for chest pain. Mar 03, 2020 05/06/20 Yes Tobb, Kardie, DO  OVER THE COUNTER MEDICATION Take 2 tablets by mouth daily.  Amberen   Yes [provider]  phenylephrine (SUDAFED PE) 10 MG TABS tablet Take 10 mg by mouth in the morning and at bedtime.    Yes [provider]  pregabalin (LYRICA) 100 MG capsule Take 100 mg by mouth 2 (two) times daily.   Yes [provider]  rosuvastatin (CRESTOR) 5 MG tablet Take 1 tablet (5 mg total) by mouth daily. 02/06/20 05/06/20 Yes Tobb, Kardie, DO  valACYclovir (VALTREX) 1000 MG tablet Take 1,000 mg by mouth daily.    Yes [provider]    Inpatient Medications: Scheduled Meds:  aspirin EC  81 mg Oral Daily   busPIRone  15 mg Oral TID   divalproex  500 mg Oral Daily   enoxaparin (LOVENOX) injection  40 mg Subcutaneous Q24H   hydrOXYzine  50 mg Oral TID   losartan  25 mg Oral Daily   nicotine  21 mg Transdermal Daily     pregabalin  100 mg Oral BID   rosuvastatin  5 mg Oral Daily   Continuous Infusions:  PRN Meds: acetaminophen, morphine injection, nitroGLYCERIN, ondansetron (ZOFRAN) IV  Allergies:    Allergies  Allergen Reactions   Cephalexin Anaphylaxis   Doxycycline Anaphylaxis   Erythromycin Base Anaphylaxis   Penicillins Anaphylaxis   Carbamazepine Nausea And Vomiting    "They told me to not take it again"    Clonidine Other (See Comments)    unknown   Lithium Nausea And Vomiting    "I just went toxic on it"    Oxcarbazepine Nausea And Vomiting    "They told me not to take it again"    Sulfamethoxazole Nausea And Vomiting   Erythromycin Rash   Latex Rash    Social History:   Social History   Socioeconomic History   Marital status: Single    Spouse name: Not on file   Number of children: Not on file   Years of education: Not on file   Highest education level: Not on file  Occupational History   Not on file  Tobacco Use   Smoking status: Current Every Day Smoker   Smokeless tobacco: Never Used  Substance and Sexual Activity   Alcohol use: Not Currently   Drug use: Yes   Sexual activity: Not on file  Other Topics Concern   Not on file  Social History Narrative   Not on file   Social Determinants of Health   Financial Resource Strain:    Difficulty of Paying Living Expenses:   Food Insecurity:    Worried About Charity fundraiser in the Last Year:    Arboriculturist in the Last Year:   Transportation Needs:    Film/video editor (Medical):    Lack of Transportation (Non-Medical):   Physical Activity:    Days of Exercise per Week:    Minutes of Exercise per Session:   Stress:    Feeling of Stress :   Social Connections:    Frequency of Communication with Friends and Family:    Frequency of Social Gatherings with Friends and Family:    Attends Religious Services:    Active Member of Clubs or Organizations:    Attends  Music therapist:    Marital Status:   Intimate Partner Violence:    Fear of Current or Ex-Partner:    Emotionally Abused:    Physically Abused:    Sexually Abused:     Family History:    Family History  Problem Relation  Age of Onset   Cancer Mother    Cancer Father    Cancer Brother      ROS:  Please see the history of present illness.  General:no colds or fevers, no weight changes Skin:no rashes or ulcers HEENT:no blurred vision, no congestion CV:see HPI PUL:see HPI GI:no diarrhea constipation or melena, no indigestion GU:no hematuria, no dysuria MS:no joint pain, no claudication Neuro:no syncope, no lightheadedness Endo:no diabetes, no thyroid disease  All other ROS reviewed and negative.     Physical Exam/Data:   Vitals:   02/12/20 0400 02/12/20 0519 02/12/20 0700 02/12/20 0815  BP: 117/67 (!) 100/56 (!) 148/91 133/80  Pulse: (!) 44 (!) 46 (!) 49 (!) 45  Resp: 16 13 16 16   Temp:  97.9 F (36.6 C)    TempSrc:  Oral    SpO2: 96% 100% 100% 96%  Weight:      Height:       No intake or output data in the 24 hours ending 02/12/20 0847 Last 3 Weights 02/11/2020 02/06/2020  Weight (lbs) 129 lb 129 lb 3.2 oz  Weight (kg) 58.514 kg 58.605 kg     Body mass index is 22.85 kg/m.  Exam by Dr. Margaretann Loveless   Relevant CV Studies: none  Laboratory Data:  High Sensitivity Troponin:   Recent Labs  Lab 02/11/20 1806 02/11/20 1948  TROPONINIHS 3 3     Chemistry Recent Labs  Lab 02/11/20 1806 02/12/20 0522  NA 143 141  K 3.0* 4.2  CL 106 109  CO2 24 26  GLUCOSE 110* 101*  BUN 14 12  CREATININE 0.65 0.40*  CALCIUM 9.3 8.8*  GFRNONAA >60 >60  GFRAA >60 >60  ANIONGAP 13 6    Recent Labs  Lab 02/11/20 1806  PROT 7.9  ALBUMIN 4.7  AST 16  ALT 13  ALKPHOS 65  BILITOT 0.7   Hematology Recent Labs  Lab 02/11/20 1806 02/12/20 0522  WBC 6.6 5.3  RBC 4.28 3.77*  HGB 13.3 11.9*  HCT 39.6 34.8*  MCV 92.5 92.3  MCH 31.1 31.6  MCHC  33.6 34.2  RDW 14.1 14.2  PLT 320 266   BNPNo results for input(s): BNP, PROBNP in the last 168 hours.  DDimer No results for input(s): DDIMER in the last 168 hours.   Radiology/Studies:  [facility] Chest Port 1 View  Result Date: 02/11/2020 CLINICAL DATA:  Chest pain and shortness of breath. EXAM: PORTABLE CHEST 1 VIEW COMPARISON:  Radiograph 03/16/2017 FINDINGS: The cardiomediastinal contours are normal. The lungs are clear. Pulmonary vasculature is normal. No consolidation, pleural effusion, or pneumothorax. No acute osseous abnormalities are seen. IMPRESSION: Unremarkable portable AP view of the chest. Electronically Signed   By: Keith Rake M.D.   On: 02/11/2020 18:24       HEAR Score (for undifferentiated chest pain):     6   Assessment and Plan:   1. Chest pain - with normal troponin, but relief with SL NTG - severe pain, + hx of HTN and HLD.  Now on medication neg troponin but with severity of symptoms would do cardiac CTA or cardiac cath.  If all normal then would consider esophageal spasm with NTG resolving pain.   2. HTN  Elevated on admit 177/98 improved after NTG now back on losartan  3. HLD on crestor 5 mg just started  4. Tobacco use on nicoderm       For questions or updates, please contact [place] Please consult [email or web address] for contact info  under    Signed, Cecilie Kicks, NP  02/12/2020 8:47 AM  Patient seen and examined with Cecilie Kicks NP.  Agree as above, with the following exceptions and changes as noted below.  She has had 4 episodes of chest discomfort.  3 of which felt like a knife stabbing her in the chest with jaw clenching and radiating pain to the right shoulder.  While sitting and eating it can [employer] she felt a pressure sensation in her chest that made her feel as if she had to belch. The pressure sensation worsened with time, and was subsequently relieved with nitroglycerin. Gen: NAD, CV: RRR, no murmurs, radial pulse 3/4 right radial.  Lungs: clear, Abd: soft, Extrem: Warm, well perfused, no edema, Neuro/Psych: alert and oriented x 3, normal mood and affect. All available labs, radiology testing, previous records reviewed.  Her chest pain is concerning for unstable angina, and I am concerned with her history of smoking and hyperlipidemia that this may represent ischemia.  In addition, I would want to exclude scad as an etiology for her chest pain given recurrences and presentation.  With an eye towards excluding scad, conventional coronary angiography would be the next best step.  I have consented the patient and participated in shared decision making.  She is willing to proceed.  Informed consent documented below.  INFORMED CONSENT: I have reviewed the risks, indications, and alternatives to cardiac catheterization, possible angioplasty, and stenting with the patient. Risks include but are not limited to bleeding, infection, vascular injury, stroke, myocardial infection, arrhythmia, kidney injury, radiation-related injury in the case of prolonged fluoroscopy use, emergency cardiac surgery, and death. The patient understands the risks of serious complication is 1-2 in 2924 with diagnostic cardiac cath and 1-2% or less with angioplasty/stenting.    Elouise Munroe, MD 02/12/20 11:28 AM

## 2020-02-12 NOTE — ED Notes (Signed)
Pt updated. Will attempt to give report again and call [facility].

## 2020-02-12 NOTE — Progress Notes (Signed)
Patient arrived via [facility] from [facility] at 1035hrs.  Oriented to room and plan of care for shift.  Patient verbalized understanding.  Denies CP/SOB on arrival.

## 2020-02-12 NOTE — Discharge Summary (Signed)
Physician Discharge Summary  Allison Kramer YWV:[id number] DOB: [birth year] DOA: 02/11/2020  PCP: Allison Ha, FNP  Admit date: 02/11/2020 Discharge date: 02/12/2020  Admitted From: home Disposition:  home  Recommendations for Outpatient Follow-up:  1. Follow up with PCP in 1-2 weeks 2. Follow-up with cardiology as scheduled  Home Health: None Equipment/Devices: None  Discharge Condition: Stable CODE STATUS: Full code Diet recommendation: Heart healthy  HPI: Per admitting MD, Allison Kramer is a 49 y.o. female with medical history significant for hypertension, hyperlipidemia, mood disorder, tobacco use, substance use disorder who presents to the ED for evaluation of chest pain.  Patient states she has had recent intermittent stabbing substernal chest pain.  She would have associated diaphoresis with shortness of breath.  It would last several minutes before resolving on its own.  She was seen by cardiology, Dr. Harriet Masson for her symptoms on 02/06/2020.  She was started on losartan for hypertension and rosuvastatin for hyperlipidemia and is scheduled to undergo echocardiogram and stress testing later this month. Patient states today 02/11/2020 she was eating a [employer] cafeteria when she had sudden onset of heavy/pressure sensation across her lower sternum.  She had radiation of discomfort to her left jaw and to her right shoulder.  She had associated "fluttering" sensation with some shortness of breath.  She had nausea without vomiting.  She had mild lightheadedness without syncope.  She denied any diaphoresis at this time.  She came to the ED for evaluation at which time she had continued 10 out of 10 pain.  She was given sublingual nitroglycerin with complete resolution, now reporting 0 out of 10 pain. She does report smoking 0.5-1 pack of cigarettes per day for the last 12 years.  She denies any recent alcohol use.  She reports occasional marijuana use.  She does report a remote  history of IV heroin use and states she is now taking oral morphine daily for the last year.  Hospital Course / Discharge diagnoses: Principal Problem Chest pain, noncardiac-cardiology consulted and evaluated patient.  Her high-sensitivity troponin was negative x2.  She underwent a 2D echo which showed normal EF 60-65% without WMA.  Out of concern initially for unstable angina underwent a left heart catheterization which showed clean coronaries.  It is likely that her chest pain may be esophageal in nature as it is responsive to nitroglycerin.  Patient improved, and was discharged home in stable condition.  Active Problems Essential hypertension-continue home medications Hyperlipidemia-continue statin Hypokalemia-repleted Substance use disorder-UDS positive for opiates as well as marijuana Tobacco use-nicotine patch, patient was advised to quit and was given a prescription for nicotine patches Mood disorder-continue home medications   Discharge Instructions   Allergies as of 02/12/2020      Reactions   Cephalexin Anaphylaxis   Doxycycline Anaphylaxis   Erythromycin Base Anaphylaxis   Penicillins Anaphylaxis   Carbamazepine Nausea And Vomiting   "They told me to not take it again"   Clonidine Other (See Comments)   unknown   Lithium Nausea And Vomiting   "I just went toxic on it"   Oxcarbazepine Nausea And Vomiting   "They told me not to take it again"   Sulfamethoxazole Nausea And Vomiting   Erythromycin Rash   Latex Rash      Medication List    TAKE these medications   busPIRone 15 MG tablet Commonly known as: BUSPAR Take 15 mg by mouth 3 (three) times daily.   Cetirizine HCl 10 MG Caps Take 10 mg by  mouth daily.   Depakote ER 500 MG 24 hr tablet Generic drug: divalproex Take 500 mg by mouth daily.   hydrOXYzine 50 MG tablet Commonly known as: ATARAX/VISTARIL Take 50 mg by mouth in the morning, at noon, and at bedtime.   ibuprofen 200 MG tablet Commonly known  as: ADVIL Take 400 mg by mouth every 6 (six) hours as needed for moderate pain.   losartan 25 MG tablet Commonly known as: COZAAR Take 1 tablet (25 mg total) by mouth daily.   mirtazapine 15 MG tablet Commonly known as: REMERON Take 15 mg by mouth as needed (sleep).   nicotine 21 mg/24hr patch Commonly known as: NICODERM CQ - dosed in mg/24 hours Place 1 patch (21 mg total) onto the skin daily. Start taking on: February 13, 2020   nitroGLYCERIN 0.4 MG SL tablet Commonly known as: NITROSTAT Place 1 tablet (0.4 mg total) under the tongue every 5 (five) minutes as needed for chest pain.   OVER THE COUNTER MEDICATION Take 2 tablets by mouth daily. Amberen   phenylephrine 10 MG Tabs tablet Commonly known as: SUDAFED PE Take 10 mg by mouth in the morning and at bedtime.   pregabalin 100 MG capsule Commonly known as: LYRICA Take 100 mg by mouth 2 (two) times daily.   rosuvastatin 5 MG tablet Commonly known as: CRESTOR Take 1 tablet (5 mg total) by mouth daily.   Valtrex 1000 MG tablet Generic drug: valACYclovir Take 1,000 mg by mouth daily.       Follow-up Information    Tobb, Kardie, DO Follow up on 03/07/2020.   Specialty: Cardiology Why: at 2 pm  Contact information: [place] [id number] [phone]               Consultations:  Cardiology   Procedures/Studies:  CARDIAC CATHETERIZATION  Result Date: 02/12/2020  The left ventricular systolic function is normal.  LV end diastolic pressure is normal.  The left ventricular ejection fraction is 55-65% by visual estimate.  There is no aortic valve stenosis.  No significant CAD. Continue preventive therapy including smoking cessation.   DG Chest Port 1 View  Result Date: 02/11/2020 CLINICAL DATA:  Chest pain and shortness of breath. EXAM: PORTABLE CHEST 1 VIEW COMPARISON:  Radiograph 03/16/2017 FINDINGS: The cardiomediastinal contours are normal. The lungs are clear. Pulmonary vasculature is  normal. No consolidation, pleural effusion, or pneumothorax. No acute osseous abnormalities are seen. IMPRESSION: Unremarkable portable AP view of the chest. Electronically Signed   By: Keith Rake M.D.   On: 02/11/2020 18:24   ECHOCARDIOGRAM COMPLETE  Result Date: 02/12/2020    ECHOCARDIOGRAM REPORT   Patient Name:   Allison Kramer Date of Exam: 02/12/2020 Medical Rec #:  [id number]               Height:       63.0 in Accession #:    [id number]              Weight:       124.4 lb Date of Birth:  11-19-1970               BSA:          1.580 m Patient Age:    48 years                BP:           113/73 mmHg Patient Gender: F  HR:           51 bpm. Exam Location:  Inpatient Procedure: 2D Echo, Cardiac Doppler and Color Doppler Indications:    Chest Pain 786.50 / R07.9  History:        Patient has no prior history of Echocardiogram examinations.                 Abnormal ECG, Signs/Symptoms:Chest Pain; Risk                 Factors:Hypertension, Dyslipidemia and Current Smoker.  Sonographer:    Vickie Epley RDCS Referring Phys: [id number] [place]  1. Left ventricular ejection fraction, by estimation, is 60 to 65%. The left ventricle has normal function. The left ventricle has no regional wall motion abnormalities. Left ventricular diastolic parameters were normal.  2. Right ventricular systolic function is normal. The right ventricular size is normal.  3. The mitral valve is normal in structure. No evidence of mitral valve regurgitation. No evidence of mitral stenosis.  4. The aortic valve is normal in structure. Aortic valve regurgitation is not visualized. No aortic stenosis is present.  5. The inferior vena cava is normal in size with greater than 50% respiratory variability, suggesting right atrial pressure of 3 mmHg. FINDINGS  Left Ventricle: Left ventricular ejection fraction, by estimation, is 60 to 65%. The left ventricle has normal function. The left ventricle  has no regional wall motion abnormalities. The left ventricular internal cavity size was normal in size. There is  no left ventricular hypertrophy. Left ventricular diastolic parameters were normal. Right Ventricle: The right ventricular size is normal. No increase in right ventricular wall thickness. Right ventricular systolic function is normal. Left Atrium: Left atrial size was normal in size. Right Atrium: Right atrial size was normal in size. Pericardium: There is no evidence of pericardial effusion. Mitral Valve: The mitral valve is normal in structure. Normal mobility of the mitral valve leaflets. No evidence of mitral valve regurgitation. No evidence of mitral valve stenosis. Tricuspid Valve: The tricuspid valve is normal in structure. Tricuspid valve regurgitation is not demonstrated. No evidence of tricuspid stenosis. Aortic Valve: The aortic valve is normal in structure. Aortic valve regurgitation is not visualized. No aortic stenosis is present. Pulmonic Valve: The pulmonic valve was normal in structure. Pulmonic valve regurgitation is not visualized. No evidence of pulmonic stenosis. Aorta: The aortic root is normal in size and structure. Venous: The inferior vena cava is normal in size with greater than 50% respiratory variability, suggesting right atrial pressure of 3 mmHg. IAS/Shunts: No atrial level shunt detected by color flow Doppler.  LEFT VENTRICLE PLAX 2D LVIDd:         5.00 cm     Diastology LVIDs:         3.50 cm     LV e' lateral:   10.80 cm/s LV PW:         0.60 cm     LV E/e' lateral: 6.5 LV IVS:        0.60 cm     LV e' medial:    6.73 cm/s LVOT diam:     1.90 cm     LV E/e' medial:  10.4 LV SV:         62 LV SV Index:   39 LVOT Area:     2.84 cm  LV Volumes (MOD) LV vol d, MOD A2C: 80.6 ml LV vol d, MOD A4C: 91.3 ml LV vol s, MOD A2C: 26.8  ml LV vol s, MOD A4C: 41.3 ml LV SV MOD A2C:     53.8 ml LV SV MOD A4C:     91.3 ml LV SV MOD BP:      53.4 ml RIGHT VENTRICLE RV S prime:     9.97  cm/s TAPSE (M-mode): 2.3 cm LEFT ATRIUM             Index       RIGHT ATRIUM          Index LA diam:        3.30 cm 2.09 cm/m  RA Area:     9.19 cm LA Vol (A2C):   31.4 ml 19.87 ml/m RA Volume:   17.10 ml 10.82 ml/m LA Vol (A4C):   22.8 ml 14.43 ml/m LA Biplane Vol: 28.4 ml 17.97 ml/m  AORTIC VALVE LVOT Vmax:   95.30 cm/s LVOT Vmean:  59.400 cm/s LVOT VTI:    0.217 m  AORTA Ao Root diam: 2.70 cm MITRAL VALVE MV Area (PHT): 3.51 cm    SHUNTS MV Decel Time: 216 msec    Systemic VTI:  0.22 m MV E velocity: 70.30 cm/s  Systemic Diam: 1.90 cm MV A velocity: 45.40 cm/s MV E/A ratio:  1.55 Mihai Croitoru MD Electronically signed by Sanda Klein MD Signature Date/Time: 02/12/2020/2:33:46 PM    Final      Subjective: - no chest pain, shortness of breath, no abdominal pain, nausea or vomiting.   Discharge Exam: BP (!) 148/82   Pulse (!) 0   Temp 98.1 F (36.7 C) (Oral)   Resp (!) 0   Ht 5\' 3"  (1.6 m)   Wt 56.4 kg   SpO2 96%   BMI 22.04 kg/m   General: Pt is alert, awake, not in acute distress Cardiovascular: RRR, S1/S2 +, no rubs, no gallops Respiratory: CTA bilaterally, no wheezing, no rhonchi Abdominal: Soft, NT, ND, bowel sounds + Extremities: no edema, no cyanosis    The results of significant diagnostics from this hospitalization (including imaging, microbiology, ancillary and laboratory) are listed below for reference.     Microbiology: Recent Results (from the past 240 hour(s))  SARS Coronavirus 2 by RT PCR (hospital order, performed in [facility] hospital lab) Nasopharyngeal Nasopharyngeal Swab     Status: None   Collection Time: 02/11/20  6:18 PM   Specimen: Nasopharyngeal Swab  Result Value Ref Range Status   SARS Coronavirus 2 NEGATIVE NEGATIVE Final    Comment: (NOTE) SARS-CoV-2 target nucleic acids are NOT DETECTED.  The SARS-CoV-2 RNA is generally detectable in upper and lower respiratory specimens during the acute phase of infection. The lowest concentration of  SARS-CoV-2 viral copies this assay can detect is 250 copies / mL. A negative result does not preclude SARS-CoV-2 infection and should not be used as the sole basis for treatment or other patient management decisions.  A negative result may occur with improper specimen collection / handling, submission of specimen other than nasopharyngeal swab, presence of viral mutation(s) within the areas targeted by this assay, and inadequate number of viral copies (<250 copies / mL). A negative result must be combined with clinical observations, patient history, and epidemiological information.  Fact Sheet for Patients:   StrictlyIdeas.no  Fact Sheet for Healthcare Providers: BankingDealers.co.za  This test is not yet approved or  cleared by the [place] FDA and has been authorized for detection and/or diagnosis of SARS-CoV-2 by FDA under an Emergency Use Authorization (EUA).  This EUA will remain in  effect (meaning this test can be used) for the duration of the COVID-19 declaration under Section 564(b)(1) of the Act, 21 U.S.C. section 360bbb-3(b)(1), unless the authorization is terminated or revoked sooner.  Performed at [facility], [place] [address], [place], [place] [address]      Labs: Basic Metabolic Panel: Recent Labs  Lab 02/11/20 1806 02/11/20 1948 02/12/20 0522  NA 143  --  141  K 3.0*  --  4.2  CL 106  --  109  CO2 24  --  26  GLUCOSE 110*  --  101*  BUN 14  --  12  CREATININE 0.65  --  0.40*  CALCIUM 9.3  --  8.8*  MG  --  2.1  --    Liver Function Tests: Recent Labs  Lab 02/11/20 1806  AST 16  ALT 13  ALKPHOS 65  BILITOT 0.7  PROT 7.9  ALBUMIN 4.7   CBC: Recent Labs  Lab 02/11/20 1806 02/12/20 0522  WBC 6.6 5.3  NEUTROABS 2.4  --   HGB 13.3 11.9*  HCT 39.6 34.8*  MCV 92.5 92.3  PLT 320 266   CBG: No results for input(s): GLUCAP in the last 168 hours. Hgb A1c No results for  input(s): HGBA1C in the last 72 hours. Lipid Profile Recent Labs    02/12/20 0522  CHOL 212*  HDL 32*  LDLCALC 139*  TRIG 203*  CHOLHDL 6.6   Thyroid function studies No results for input(s): TSH, T4TOTAL, T3FREE, THYROIDAB in the last 72 hours.  Invalid input(s): FREET3 Urinalysis No results found for: COLORURINE, APPEARANCEUR, LABSPEC, PHURINE, GLUCOSEU, HGBUR, BILIRUBINUR, KETONESUR, PROTEINUR, UROBILINOGEN, NITRITE, LEUKOCYTESUR  FURTHER DISCHARGE INSTRUCTIONS:   Get Medicines reviewed and adjusted: Please take all your medications with you for your next visit with your Primary MD   Laboratory/radiological data: Please request your Primary MD to go over all hospital tests and procedure/radiological results at the follow up, please ask your Primary MD to get all Hospital records sent to his/her office.   In some cases, they will be blood work, cultures and biopsy results pending at the time of your discharge. Please request that your primary care M.D. goes through all the records of your hospital data and follows up on these results.   Also Note the following: If you experience worsening of your admission symptoms, develop shortness of breath, life threatening emergency, suicidal or homicidal thoughts you must seek medical attention immediately by calling 911 or calling your MD immediately  if symptoms less severe.   You must read complete instructions/literature along with all the possible adverse reactions/side effects for all the Medicines you take and that have been prescribed to you. Take any new Medicines after you have completely understood and accpet all the possible adverse reactions/side effects.    Do not drive when taking Pain medications or sleeping medications (Benzodaizepines)   Do not take more than prescribed Pain, Sleep and Anxiety Medications. It is not advisable to combine anxiety,sleep and pain medications without talking with your primary care  practitioner   Special Instructions: If you have smoked or chewed Tobacco  in the last 2 yrs please stop smoking, stop any regular Alcohol  and or any Recreational drug use.   Wear Seat belts while driving.   Please note: You were cared for by a hospitalist during your hospital stay. Once you are discharged, your primary care physician will handle any further medical issues. Please note that NO REFILLS for any discharge medications  will be authorized once you are discharged, as it is imperative that you return to your primary care physician (or establish a relationship with a primary care physician if you do not have one) for your post hospital discharge needs so that they can reassess your need for medications and monitor your lab values.  Time coordinating discharge: 45 minutes  SIGNED:  Marzetta Board, MD, PhD 02/12/2020, 4:16 PM

## 2020-02-12 NOTE — Discharge Instructions (Signed)
Call [facility] at [phone] if any bleeding, swelling or drainage at cath site.  May shower, no tub baths for 48 hours for groin sticks. No lifting over 5 pounds for 3 days.  No Driving for 3 days  We Cancelled your outpatient studies because the cardiac cath was more informative.     Keep your follow up with Dr. Harriet Masson

## 2020-02-12 NOTE — Progress Notes (Signed)
TR band removed at 1830hrs. Patient ambulated in room without problems.  Patient voided.  Reviewed all d/c instruction with patient. Patient verbalized understanding.

## 2020-02-12 NOTE — ED Notes (Addendum)
Report given to accepting nurse, Vaughan Basta. [facility] called for transport.

## 2020-02-12 NOTE — Progress Notes (Signed)
Patient taken to cath lab via bed at 1510hrs.

## 2020-02-13 ENCOUNTER — Encounter (HOSPITAL_COMMUNITY): Payer: Self-pay | Admitting: Interventional Cardiology

## 2020-02-22 ENCOUNTER — Other Ambulatory Visit: Payer: Self-pay

## 2020-03-07 ENCOUNTER — Ambulatory Visit: Payer: Self-pay | Admitting: Cardiology

## 2021-10-07 IMAGING — DX DG CHEST 1V PORT
1 series · 1 of 1 positions shown · non-contrast
Comparison: Radiograph 03/16/2017

CLINICAL DATA: Chest pain and shortness of breath.

EXAM:
PORTABLE CHEST 1 VIEW

[chest ap]
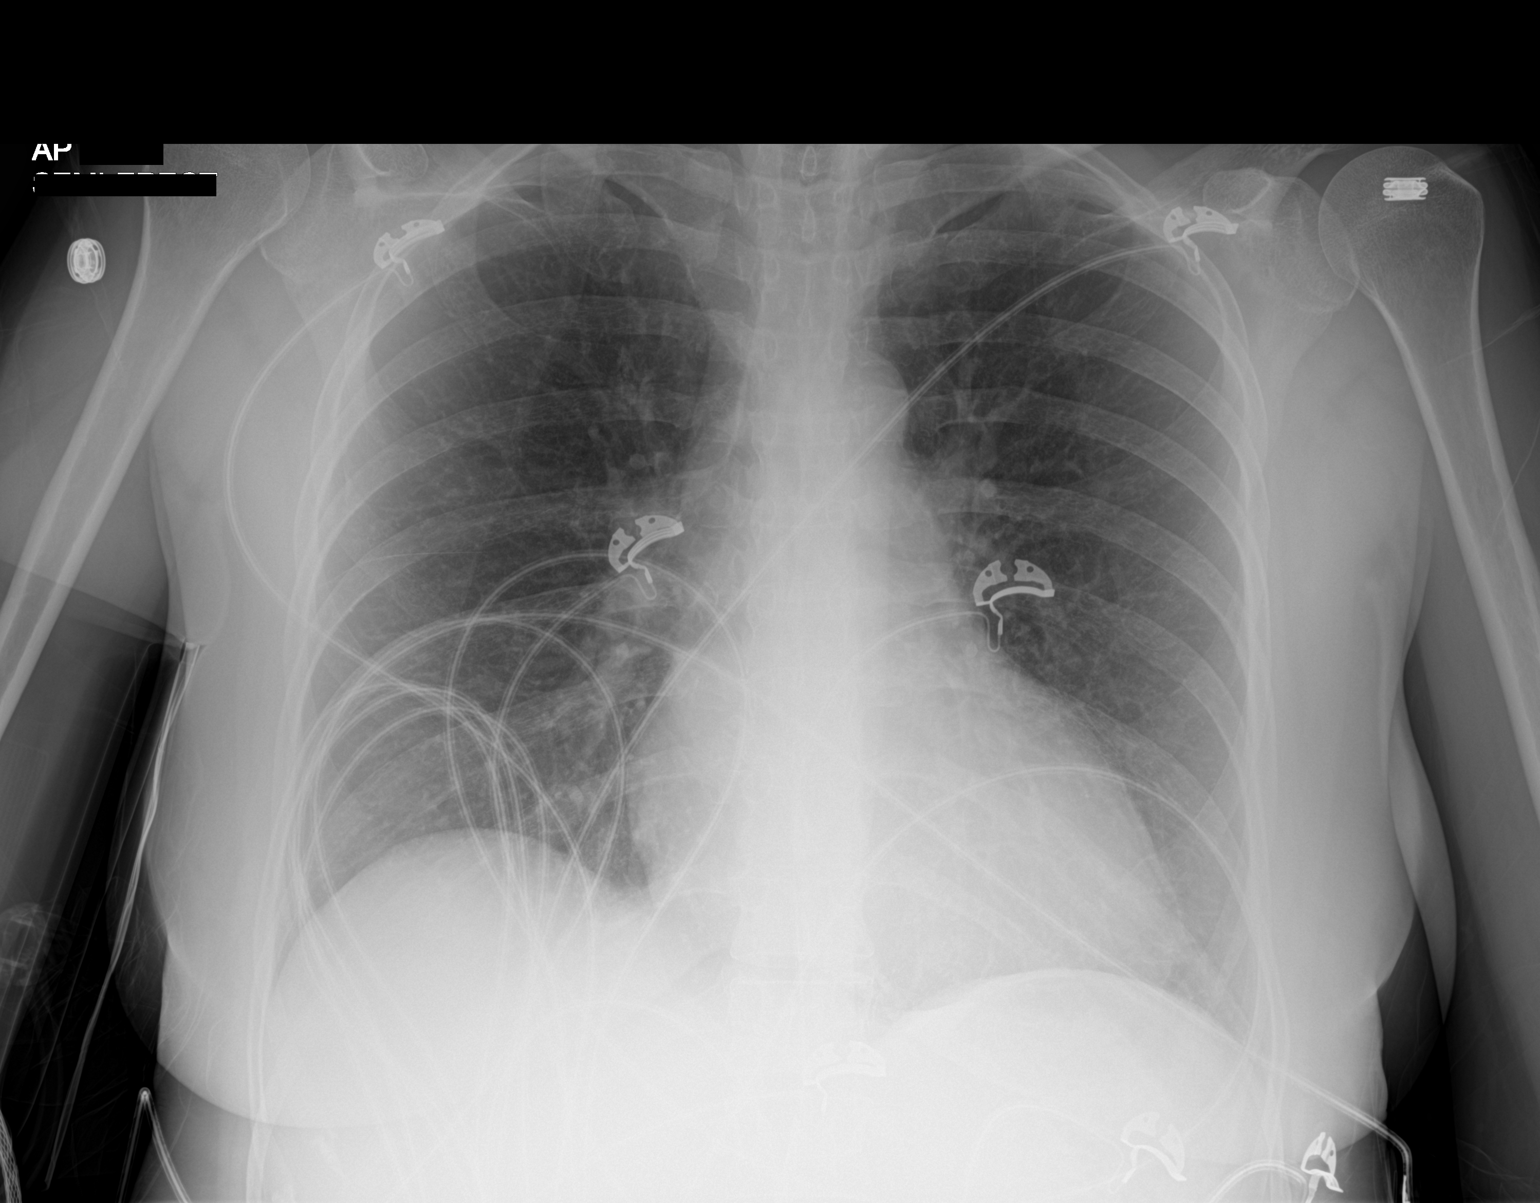

[1 of 1 positions shown; findings below may reference images not displayed]

FINDINGS: The cardiomediastinal contours are normal. The lungs are clear.
Pulmonary vasculature is normal. No consolidation, pleural effusion,
or pneumothorax. No acute osseous abnormalities are seen.
IMPRESSION: Unremarkable portable AP view of the chest.
